# Patient Record
Sex: Male | Born: 1945 | Race: White | Hispanic: No | Marital: Married | Smoking: Former smoker
Health system: Southern US, Community
[De-identification: ages and names within clinical notes are randomized; demographics above are authoritative.]

## PROBLEM LIST (undated history)

## (undated) DIAGNOSIS — I10 Essential (primary) hypertension: Secondary | ICD-10-CM

## (undated) DIAGNOSIS — J449 Chronic obstructive pulmonary disease, unspecified: Secondary | ICD-10-CM

## (undated) DIAGNOSIS — C61 Malignant neoplasm of prostate: Secondary | ICD-10-CM

## (undated) DIAGNOSIS — E1139 Type 2 diabetes mellitus with other diabetic ophthalmic complication: Secondary | ICD-10-CM

## (undated) DIAGNOSIS — M545 Low back pain, unspecified: Secondary | ICD-10-CM

## (undated) DIAGNOSIS — C220 Liver cell carcinoma: Secondary | ICD-10-CM

## (undated) DIAGNOSIS — F329 Major depressive disorder, single episode, unspecified: Secondary | ICD-10-CM

## (undated) DIAGNOSIS — I5032 Chronic diastolic (congestive) heart failure: Secondary | ICD-10-CM

## (undated) DIAGNOSIS — E785 Hyperlipidemia, unspecified: Secondary | ICD-10-CM

## (undated) DIAGNOSIS — E1165 Type 2 diabetes mellitus with hyperglycemia: Secondary | ICD-10-CM

## (undated) DIAGNOSIS — E11339 Type 2 diabetes mellitus with moderate nonproliferative diabetic retinopathy without macular edema: Secondary | ICD-10-CM

## (undated) DIAGNOSIS — M79643 Pain in unspecified hand: Secondary | ICD-10-CM

## (undated) DIAGNOSIS — I2510 Atherosclerotic heart disease of native coronary artery without angina pectoris: Secondary | ICD-10-CM

## (undated) DIAGNOSIS — E669 Obesity, unspecified: Secondary | ICD-10-CM

## (undated) DIAGNOSIS — Z87891 Personal history of nicotine dependence: Secondary | ICD-10-CM

## (undated) DIAGNOSIS — IMO0002 Reserved for concepts with insufficient information to code with codable children: Secondary | ICD-10-CM

## (undated) DIAGNOSIS — K219 Gastro-esophageal reflux disease without esophagitis: Secondary | ICD-10-CM

## (undated) DIAGNOSIS — H409 Unspecified glaucoma: Secondary | ICD-10-CM

## (undated) DIAGNOSIS — F411 Generalized anxiety disorder: Secondary | ICD-10-CM

## (undated) DIAGNOSIS — E039 Hypothyroidism, unspecified: Secondary | ICD-10-CM

## (undated) DIAGNOSIS — M25579 Pain in unspecified ankle and joints of unspecified foot: Secondary | ICD-10-CM

## (undated) DIAGNOSIS — F3289 Other specified depressive episodes: Secondary | ICD-10-CM

## (undated) DIAGNOSIS — I878 Other specified disorders of veins: Secondary | ICD-10-CM

## (undated) DIAGNOSIS — G4730 Sleep apnea, unspecified: Secondary | ICD-10-CM

## (undated) HISTORY — DX: Obesity, unspecified: E66.9

## (undated) HISTORY — DX: Atherosclerotic heart disease of native coronary artery without angina pectoris: I25.10

## (undated) HISTORY — DX: Type 2 diabetes mellitus with moderate nonproliferative diabetic retinopathy without macular edema: E11.339

## (undated) HISTORY — DX: Unspecified glaucoma: H40.9

## (undated) HISTORY — DX: Other specified disorders of veins: I87.8

## (undated) HISTORY — DX: Major depressive disorder, single episode, unspecified: F32.9

## (undated) HISTORY — DX: Malignant neoplasm of prostate: C61

## (undated) HISTORY — DX: Gastro-esophageal reflux disease without esophagitis: K21.9

## (undated) HISTORY — DX: Low back pain, unspecified: M54.50

## (undated) HISTORY — DX: Chronic diastolic (congestive) heart failure: I50.32

## (undated) HISTORY — DX: Personal history of nicotine dependence: Z87.891

## (undated) HISTORY — DX: Low back pain: M54.5

## (undated) HISTORY — PX: OTHER SURGICAL HISTORY: SHX169

## (undated) HISTORY — DX: Pain in unspecified ankle and joints of unspecified foot: M25.579

## (undated) HISTORY — DX: Sleep apnea, unspecified: G47.30

## (undated) HISTORY — DX: Type 2 diabetes mellitus with hyperglycemia: E11.65

## (undated) HISTORY — DX: Essential (primary) hypertension: I10

## (undated) HISTORY — DX: Other specified depressive episodes: F32.89

## (undated) HISTORY — DX: Morbid (severe) obesity due to excess calories: E66.01

## (undated) HISTORY — DX: Type 2 diabetes mellitus with other diabetic ophthalmic complication: E11.39

## (undated) HISTORY — DX: Reserved for concepts with insufficient information to code with codable children: IMO0002

## (undated) HISTORY — DX: Hypothyroidism, unspecified: E03.9

## (undated) HISTORY — DX: Hyperlipidemia, unspecified: E78.5

## (undated) HISTORY — DX: Generalized anxiety disorder: F41.1

## (undated) HISTORY — DX: Pain in unspecified hand: M79.643

## (undated) HISTORY — DX: Chronic obstructive pulmonary disease, unspecified: J44.9

## (undated) HISTORY — DX: Liver cell carcinoma: C22.0

---

## 1967-11-07 DIAGNOSIS — Z87891 Personal history of nicotine dependence: Secondary | ICD-10-CM

## 1995-11-07 DIAGNOSIS — I10 Essential (primary) hypertension: Secondary | ICD-10-CM | POA: Insufficient documentation

## 1995-11-07 DIAGNOSIS — M545 Low back pain: Secondary | ICD-10-CM

## 1995-11-07 DIAGNOSIS — E1165 Type 2 diabetes mellitus with hyperglycemia: Secondary | ICD-10-CM

## 1995-11-07 DIAGNOSIS — E1139 Type 2 diabetes mellitus with other diabetic ophthalmic complication: Secondary | ICD-10-CM

## 1995-11-07 DIAGNOSIS — E039 Hypothyroidism, unspecified: Secondary | ICD-10-CM | POA: Insufficient documentation

## 1995-11-07 DIAGNOSIS — E113399 Type 2 diabetes mellitus with moderate nonproliferative diabetic retinopathy without macular edema, unspecified eye: Secondary | ICD-10-CM | POA: Insufficient documentation

## 1995-11-07 DIAGNOSIS — I209 Angina pectoris, unspecified: Secondary | ICD-10-CM

## 1995-11-07 DIAGNOSIS — F411 Generalized anxiety disorder: Secondary | ICD-10-CM | POA: Insufficient documentation

## 1995-11-07 DIAGNOSIS — E11339 Type 2 diabetes mellitus with moderate nonproliferative diabetic retinopathy without macular edema: Secondary | ICD-10-CM

## 1996-11-06 HISTORY — PX: FRACTURE SURGERY: SHX138

## 1996-11-06 HISTORY — PX: KNEE ARTHROSCOPY: SUR90

## 1997-04-06 DIAGNOSIS — E785 Hyperlipidemia, unspecified: Secondary | ICD-10-CM

## 1997-11-06 DIAGNOSIS — C61 Malignant neoplasm of prostate: Secondary | ICD-10-CM

## 1997-11-06 HISTORY — DX: Malignant neoplasm of prostate: C61

## 1997-11-06 HISTORY — PX: CORONARY STENT PLACEMENT: SHX1402

## 1998-03-06 DIAGNOSIS — K219 Gastro-esophageal reflux disease without esophagitis: Secondary | ICD-10-CM

## 1998-05-06 ENCOUNTER — Encounter: Payer: Self-pay | Admitting: Family Medicine

## 1998-05-06 LAB — CONVERTED CEMR LAB: PSA: 5.9 ng/mL

## 1998-06-06 DIAGNOSIS — C61 Malignant neoplasm of prostate: Secondary | ICD-10-CM

## 1998-06-06 HISTORY — PX: PROSTATECTOMY: SHX69

## 1998-09-14 DIAGNOSIS — I2510 Atherosclerotic heart disease of native coronary artery without angina pectoris: Secondary | ICD-10-CM

## 1998-11-06 HISTORY — PX: CORONARY STENT PLACEMENT: SHX1402

## 1999-06-07 ENCOUNTER — Encounter: Payer: Self-pay | Admitting: Family Medicine

## 1999-06-07 LAB — CONVERTED CEMR LAB: Microalbumin U total vol: 248.7 mg/L

## 2001-05-06 ENCOUNTER — Encounter: Payer: Self-pay | Admitting: Family Medicine

## 2002-03-06 ENCOUNTER — Encounter: Payer: Self-pay | Admitting: Family Medicine

## 2002-03-06 LAB — CONVERTED CEMR LAB
Microalbumin U total vol: 22.9 mg/L
PSA: 0 ng/mL

## 2003-09-24 ENCOUNTER — Other Ambulatory Visit: Payer: Self-pay

## 2004-06-06 ENCOUNTER — Encounter: Payer: Self-pay | Admitting: Family Medicine

## 2004-06-06 LAB — CONVERTED CEMR LAB: Microalbumin U total vol: 73.7 mg/L

## 2004-09-27 ENCOUNTER — Ambulatory Visit: Payer: Self-pay | Admitting: Family Medicine

## 2004-10-27 ENCOUNTER — Ambulatory Visit: Payer: Self-pay | Admitting: Family Medicine

## 2005-01-25 ENCOUNTER — Ambulatory Visit: Payer: Self-pay | Admitting: Family Medicine

## 2005-03-14 ENCOUNTER — Ambulatory Visit: Payer: No Typology Code available for payment source | Admitting: Urology

## 2005-03-14 ENCOUNTER — Other Ambulatory Visit: Payer: Self-pay

## 2005-03-20 ENCOUNTER — Ambulatory Visit: Payer: No Typology Code available for payment source | Admitting: Urology

## 2005-06-06 ENCOUNTER — Encounter: Payer: Self-pay | Admitting: Family Medicine

## 2005-06-06 LAB — CONVERTED CEMR LAB
Hgb A1c MFr Bld: 8.5 %
Microalbumin U total vol: 47.9 mg/L
PSA: 0 ng/mL

## 2005-07-06 ENCOUNTER — Ambulatory Visit: Payer: Self-pay | Admitting: Family Medicine

## 2005-07-13 ENCOUNTER — Ambulatory Visit: Payer: Self-pay | Admitting: Family Medicine

## 2005-07-21 ENCOUNTER — Ambulatory Visit: Payer: Self-pay | Admitting: Family Medicine

## 2005-07-31 ENCOUNTER — Ambulatory Visit: Payer: Self-pay | Admitting: Family Medicine

## 2005-08-04 ENCOUNTER — Ambulatory Visit: Payer: Self-pay | Admitting: Family Medicine

## 2005-09-20 ENCOUNTER — Ambulatory Visit: Payer: Self-pay | Admitting: Family Medicine

## 2005-12-07 ENCOUNTER — Encounter: Payer: Self-pay | Admitting: Family Medicine

## 2005-12-07 LAB — CONVERTED CEMR LAB: Hgb A1c MFr Bld: 9.2 %

## 2005-12-19 ENCOUNTER — Ambulatory Visit: Payer: Self-pay | Admitting: Family Medicine

## 2006-03-19 ENCOUNTER — Ambulatory Visit: Payer: Self-pay | Admitting: Family Medicine

## 2006-04-20 ENCOUNTER — Ambulatory Visit: Payer: Self-pay | Admitting: Family Medicine

## 2006-05-21 ENCOUNTER — Ambulatory Visit: Payer: Self-pay | Admitting: Family Medicine

## 2006-06-06 ENCOUNTER — Encounter: Payer: Self-pay | Admitting: Family Medicine

## 2006-06-06 LAB — CONVERTED CEMR LAB: Hgb A1c MFr Bld: 8.7 %

## 2006-06-25 ENCOUNTER — Ambulatory Visit: Payer: Self-pay | Admitting: Family Medicine

## 2006-07-23 ENCOUNTER — Ambulatory Visit: Payer: Self-pay | Admitting: Family Medicine

## 2006-08-20 ENCOUNTER — Ambulatory Visit: Payer: Self-pay | Admitting: Family Medicine

## 2006-09-20 ENCOUNTER — Ambulatory Visit: Payer: Self-pay | Admitting: Family Medicine

## 2006-09-21 ENCOUNTER — Ambulatory Visit: Payer: Self-pay | Admitting: Cardiology

## 2006-09-21 ENCOUNTER — Encounter: Payer: Self-pay | Admitting: Cardiology

## 2006-10-22 ENCOUNTER — Ambulatory Visit: Payer: Self-pay | Admitting: Family Medicine

## 2006-10-24 ENCOUNTER — Ambulatory Visit: Payer: Self-pay | Admitting: Cardiology

## 2007-01-08 ENCOUNTER — Ambulatory Visit: Payer: Self-pay | Admitting: Family Medicine

## 2007-01-08 LAB — CONVERTED CEMR LAB
Free T4: 0.7 ng/dL (ref 0.6–1.6)
TSH: 2.76 microintl units/mL (ref 0.35–5.50)

## 2007-02-08 ENCOUNTER — Encounter: Payer: Self-pay | Admitting: Family Medicine

## 2007-03-18 ENCOUNTER — Telehealth (INDEPENDENT_AMBULATORY_CARE_PROVIDER_SITE_OTHER): Payer: Self-pay | Admitting: *Deleted

## 2007-04-17 ENCOUNTER — Telehealth (INDEPENDENT_AMBULATORY_CARE_PROVIDER_SITE_OTHER): Payer: Self-pay | Admitting: *Deleted

## 2007-05-21 ENCOUNTER — Telehealth (INDEPENDENT_AMBULATORY_CARE_PROVIDER_SITE_OTHER): Payer: Self-pay | Admitting: *Deleted

## 2007-06-04 ENCOUNTER — Encounter (INDEPENDENT_AMBULATORY_CARE_PROVIDER_SITE_OTHER): Payer: Self-pay | Admitting: *Deleted

## 2007-06-14 ENCOUNTER — Ambulatory Visit: Payer: Self-pay | Admitting: Family Medicine

## 2007-06-19 ENCOUNTER — Ambulatory Visit: Payer: Self-pay | Admitting: Family Medicine

## 2007-06-21 ENCOUNTER — Ambulatory Visit: Payer: Self-pay | Admitting: Cardiology

## 2007-06-23 LAB — CONVERTED CEMR LAB
AST: 37 units/L (ref 0–37)
Bilirubin, Direct: 0.2 mg/dL (ref 0.0–0.3)
CO2: 31 meq/L (ref 19–32)
Chloride: 101 meq/L (ref 96–112)
Cholesterol: 136 mg/dL (ref 0–200)
Creatinine, Ser: 1.1 mg/dL (ref 0.4–1.5)
Creatinine,U: 154.9 mg/dL
Eosinophils Relative: 2.5 % (ref 0.0–5.0)
Glucose, Bld: 311 mg/dL — ABNORMAL HIGH (ref 70–99)
HCT: 46.4 % (ref 39.0–52.0)
Hemoglobin: 15.9 g/dL (ref 13.0–17.0)
LDL Cholesterol: 87 mg/dL (ref 0–99)
MCHC: 34.3 g/dL (ref 30.0–36.0)
Microalb, Ur: 18.5 mg/dL — ABNORMAL HIGH (ref 0.0–1.9)
Monocytes Absolute: 0.9 10*3/uL — ABNORMAL HIGH (ref 0.2–0.7)
Neutrophils Relative %: 61.2 % (ref 43.0–77.0)
PSA: <0.03 ng/mL — ABNORMAL LOW (ref 0.10–4.00)
Potassium: 4.5 meq/L (ref 3.5–5.1)
RBC: 5.19 M/uL (ref 4.22–5.81)
RDW: 13.2 % (ref 11.5–14.6)
Sodium: 141 meq/L (ref 135–145)
Total Bilirubin: 0.8 mg/dL (ref 0.3–1.2)
Total CHOL/HDL Ratio: 4.1
Total Protein: 6.9 g/dL (ref 6.0–8.3)
WBC: 9 10*3/uL (ref 4.5–10.5)

## 2007-06-25 ENCOUNTER — Ambulatory Visit: Payer: Self-pay | Admitting: Family Medicine

## 2007-07-01 ENCOUNTER — Ambulatory Visit: Payer: No Typology Code available for payment source | Admitting: Specialist

## 2007-07-12 ENCOUNTER — Telehealth (INDEPENDENT_AMBULATORY_CARE_PROVIDER_SITE_OTHER): Payer: Self-pay | Admitting: *Deleted

## 2007-08-14 ENCOUNTER — Telehealth (INDEPENDENT_AMBULATORY_CARE_PROVIDER_SITE_OTHER): Payer: Self-pay | Admitting: *Deleted

## 2007-09-12 ENCOUNTER — Telehealth (INDEPENDENT_AMBULATORY_CARE_PROVIDER_SITE_OTHER): Payer: Self-pay | Admitting: *Deleted

## 2007-10-14 ENCOUNTER — Telehealth (INDEPENDENT_AMBULATORY_CARE_PROVIDER_SITE_OTHER): Payer: Self-pay | Admitting: *Deleted

## 2007-11-14 ENCOUNTER — Telehealth: Payer: Self-pay | Admitting: Family Medicine

## 2007-12-16 ENCOUNTER — Telehealth (INDEPENDENT_AMBULATORY_CARE_PROVIDER_SITE_OTHER): Payer: Self-pay | Admitting: *Deleted

## 2007-12-19 ENCOUNTER — Encounter (INDEPENDENT_AMBULATORY_CARE_PROVIDER_SITE_OTHER): Payer: Self-pay | Admitting: *Deleted

## 2007-12-19 ENCOUNTER — Ambulatory Visit: Payer: Self-pay | Admitting: Family Medicine

## 2008-01-14 ENCOUNTER — Telehealth: Payer: Self-pay | Admitting: Family Medicine

## 2008-02-10 ENCOUNTER — Telehealth: Payer: Self-pay | Admitting: Family Medicine

## 2008-03-07 ENCOUNTER — Emergency Department: Payer: No Typology Code available for payment source | Admitting: Emergency Medicine

## 2008-03-12 ENCOUNTER — Telehealth: Payer: Self-pay | Admitting: Family Medicine

## 2008-04-09 ENCOUNTER — Telehealth: Payer: Self-pay | Admitting: Family Medicine

## 2008-05-01 IMAGING — CR DG ANKLE 2V *L*
1 series · 2 of 2 positions shown · non-contrast
Comparison: none

REASON FOR EXAM: hardware removal
COMMENTS:

[Series 1: view not recorded · 0.17mm/px · 2 of 2 slices shown]
[im 1/2]
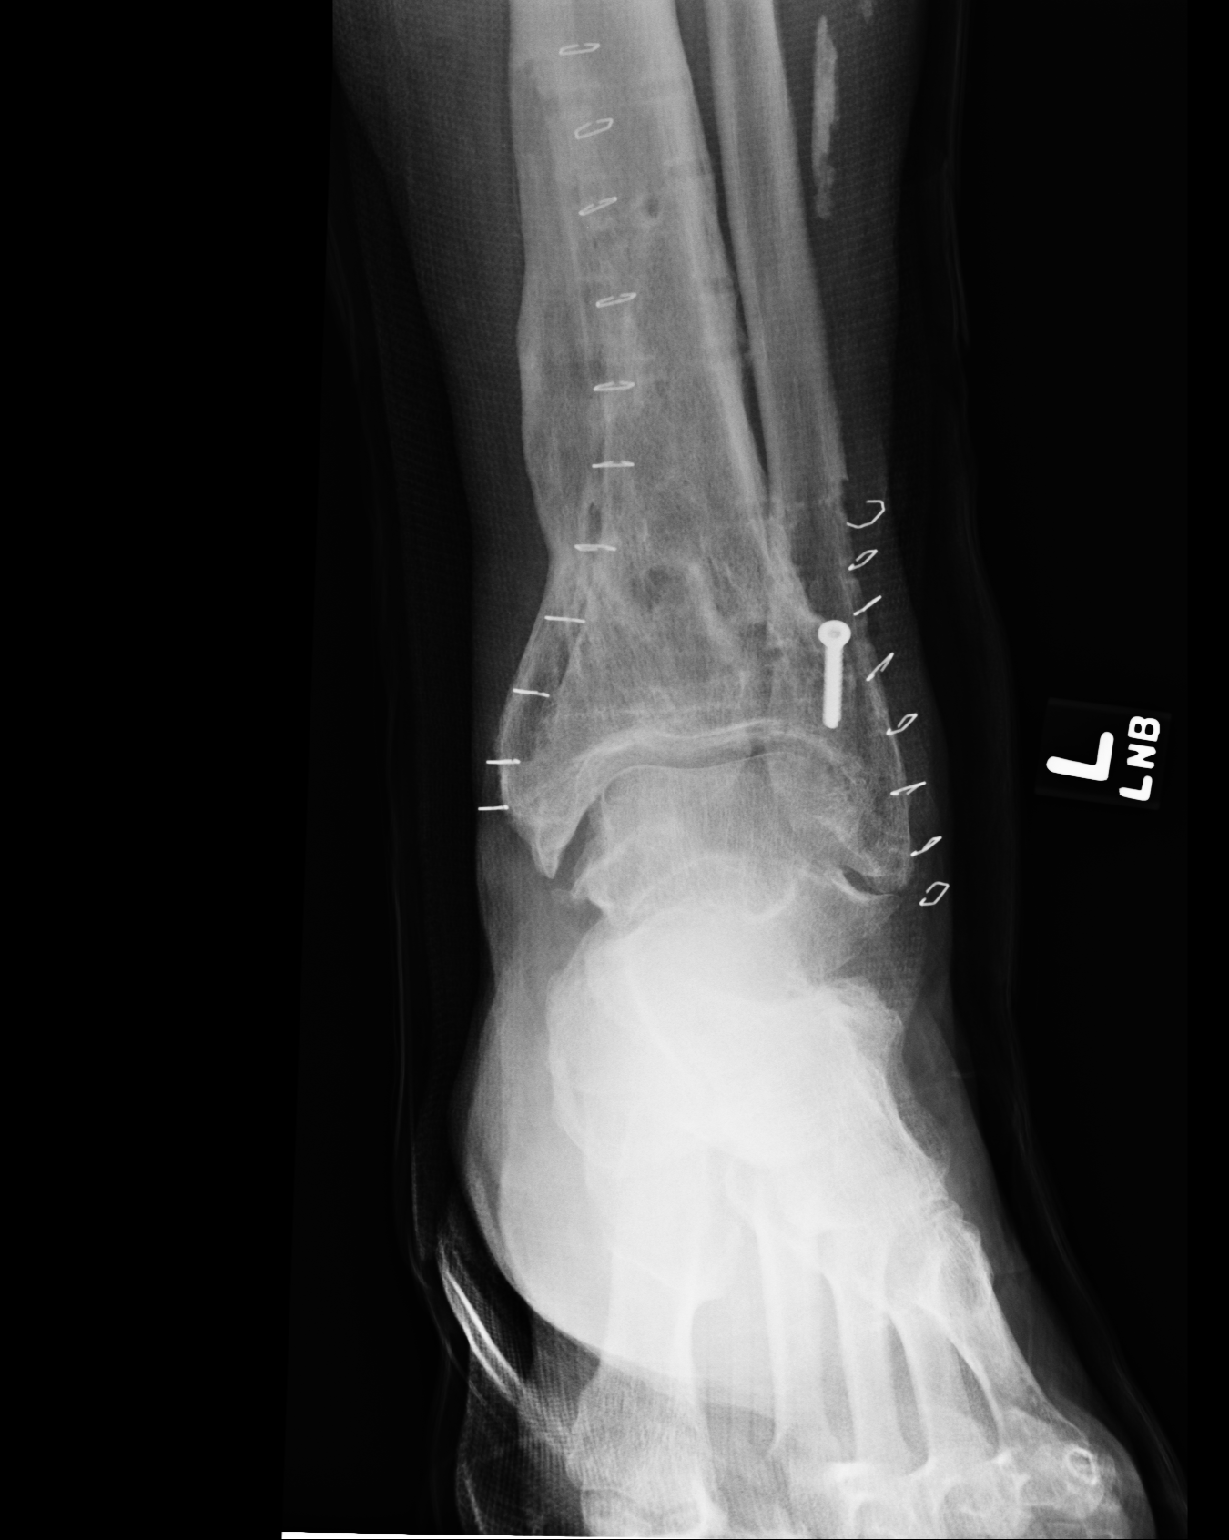
[im 2/2]
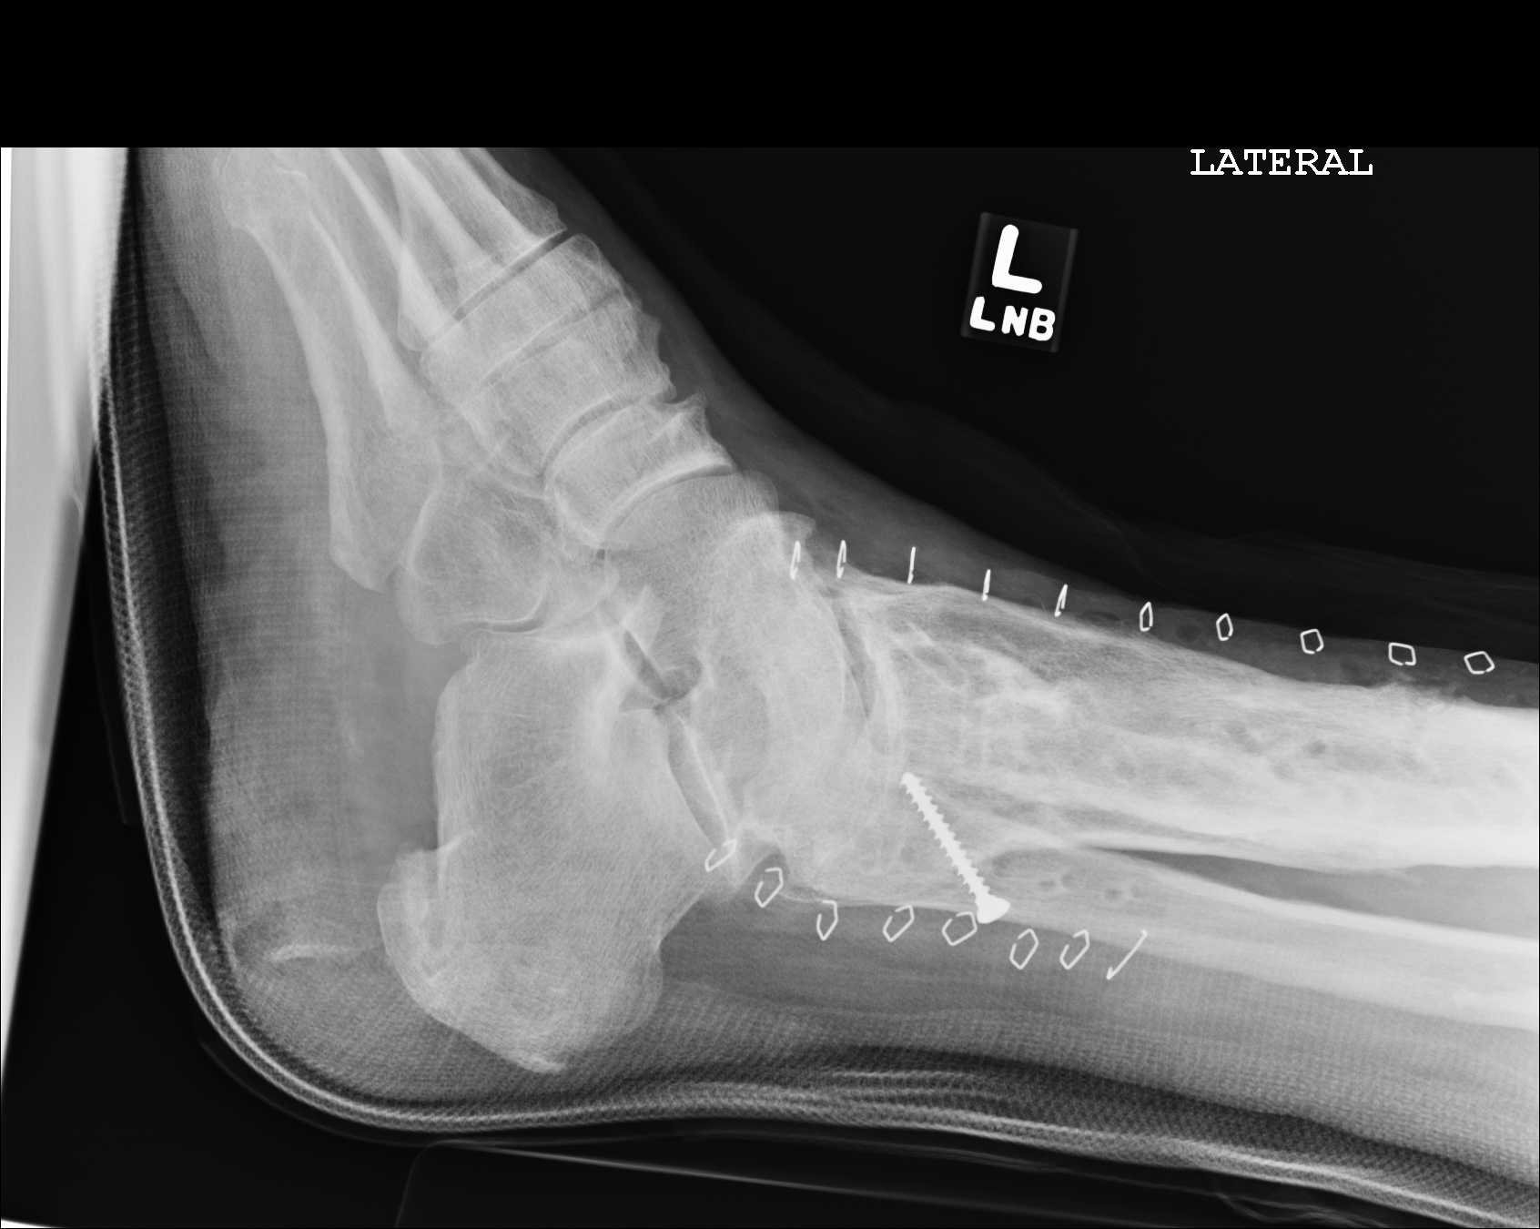

[2 of 2 positions shown; findings below may reference images not displayed]

PROCEDURE:     DXR - DXR ANKLE LEFT AP AND LATERAL  - July 01, 2007  [DATE]

RESULT:     AP and lateral views were obtained postoperatively. There are
noted screw tracks in the distal tibia compatible with the presence of
stabilizing hardware that has now been removed. It appears that a fibular
side plate has also been removed. There persists a single screw directed in
the posterior anterior plane in the distal fibula. There is deformity of the
distal tibia compatible with residual change from the patient's prior
fracture that has now healed.
IMPRESSION: Please see above.

## 2008-05-11 ENCOUNTER — Telehealth: Payer: Self-pay | Admitting: Family Medicine

## 2008-06-11 ENCOUNTER — Telehealth (INDEPENDENT_AMBULATORY_CARE_PROVIDER_SITE_OTHER): Payer: Self-pay | Admitting: Internal Medicine

## 2008-06-25 ENCOUNTER — Ambulatory Visit: Payer: Self-pay | Admitting: Family Medicine

## 2008-06-25 LAB — CONVERTED CEMR LAB
ALT: 42 units/L (ref 0–53)
CO2: 29 meq/L (ref 19–32)
Calcium: 9.1 mg/dL (ref 8.4–10.5)
Creatinine, Ser: 0.9 mg/dL (ref 0.4–1.5)
Creatinine,U: 244.1 mg/dL
Free T4: 1 ng/dL (ref 0.6–1.6)
GFR calc Af Amer: 110 mL/min
HDL: 33 mg/dL — ABNORMAL LOW (ref >39.0)
LDL Cholesterol: 126 mg/dL — ABNORMAL HIGH (ref 0–99)
Microalb Creat Ratio: 68.4 mg/g — ABNORMAL HIGH (ref 0.0–30.0)
Microalb, Ur: 16.7 mg/dL — ABNORMAL HIGH (ref 0.0–1.9)
Total Bilirubin: 0.9 mg/dL (ref 0.3–1.2)
Total CHOL/HDL Ratio: 5.3
Total Protein: 6.6 g/dL (ref 6.0–8.3)
Triglycerides: 78 mg/dL (ref 0–149)
VLDL: 16 mg/dL (ref 0–40)

## 2008-06-29 ENCOUNTER — Ambulatory Visit: Payer: Self-pay | Admitting: Family Medicine

## 2008-08-12 ENCOUNTER — Telehealth: Payer: Self-pay | Admitting: Family Medicine

## 2008-09-11 ENCOUNTER — Telehealth: Payer: Self-pay | Admitting: Family Medicine

## 2008-09-15 ENCOUNTER — Ambulatory Visit: Payer: Self-pay | Admitting: Family Medicine

## 2008-09-15 LAB — CONVERTED CEMR LAB
HDL: 27.7 mg/dL — ABNORMAL LOW (ref >39.0)
LDL Cholesterol: 98 mg/dL (ref 0–99)
Total CHOL/HDL Ratio: 5
Triglycerides: 68 mg/dL (ref 0–149)

## 2008-09-28 ENCOUNTER — Ambulatory Visit: Payer: Self-pay | Admitting: Family Medicine

## 2008-10-08 ENCOUNTER — Telehealth: Payer: Self-pay | Admitting: Family Medicine

## 2008-11-09 ENCOUNTER — Telehealth: Payer: Self-pay | Admitting: Family Medicine

## 2008-12-09 ENCOUNTER — Telehealth: Payer: Self-pay | Admitting: Family Medicine

## 2009-01-07 ENCOUNTER — Telehealth: Payer: Self-pay | Admitting: Family Medicine

## 2009-02-08 ENCOUNTER — Telehealth: Payer: Self-pay | Admitting: Family Medicine

## 2009-03-09 ENCOUNTER — Telehealth: Payer: Self-pay | Admitting: Family Medicine

## 2009-04-06 ENCOUNTER — Telehealth: Payer: Self-pay | Admitting: Family Medicine

## 2009-05-06 ENCOUNTER — Telehealth: Payer: Self-pay | Admitting: Family Medicine

## 2009-06-07 ENCOUNTER — Telehealth: Payer: Self-pay | Admitting: Family Medicine

## 2009-07-01 ENCOUNTER — Ambulatory Visit: Payer: Self-pay | Admitting: Family Medicine

## 2009-07-01 LAB — CONVERTED CEMR LAB
ALT: 42 units/L (ref 0–53)
Basophils Relative: 0.5 % (ref 0.0–3.0)
Bilirubin, Direct: 0.1 mg/dL (ref 0.0–0.3)
Chloride: 99 meq/L (ref 96–112)
Eosinophils Relative: 2.2 % (ref 0.0–5.0)
Free T4: 0.9 ng/dL (ref 0.6–1.6)
HCT: 48.1 % (ref 39.0–52.0)
Hemoglobin: 16.2 g/dL (ref 13.0–17.0)
Hgb A1c MFr Bld: 10.9 % — ABNORMAL HIGH (ref 4.6–6.5)
LDL Cholesterol: 105 mg/dL — ABNORMAL HIGH (ref 0–99)
Lymphs Abs: 2 10*3/uL (ref 0.7–4.0)
MCV: 87.7 fL (ref 78.0–100.0)
Monocytes Absolute: 0.7 10*3/uL (ref 0.1–1.0)
Potassium: 4.1 meq/L (ref 3.5–5.1)
RBC: 5.49 M/uL (ref 4.22–5.81)
TSH: 4.67 microintl units/mL (ref 0.35–5.50)
Total CHOL/HDL Ratio: 5
Total Protein: 6.9 g/dL (ref 6.0–8.3)
WBC: 7.2 10*3/uL (ref 4.5–10.5)

## 2009-07-05 ENCOUNTER — Ambulatory Visit: Payer: Self-pay | Admitting: Family Medicine

## 2009-07-05 DIAGNOSIS — E559 Vitamin D deficiency, unspecified: Secondary | ICD-10-CM | POA: Insufficient documentation

## 2009-07-26 ENCOUNTER — Ambulatory Visit: Payer: Self-pay | Admitting: Family Medicine

## 2009-07-26 LAB — CONVERTED CEMR LAB
OCCULT 2: NEGATIVE
OCCULT 3: NEGATIVE

## 2009-07-27 ENCOUNTER — Encounter (INDEPENDENT_AMBULATORY_CARE_PROVIDER_SITE_OTHER): Payer: Self-pay | Admitting: *Deleted

## 2009-08-06 ENCOUNTER — Telehealth: Payer: Self-pay | Admitting: Family Medicine

## 2009-08-16 ENCOUNTER — Ambulatory Visit: Payer: Self-pay | Admitting: Family Medicine

## 2009-09-06 ENCOUNTER — Telehealth: Payer: Self-pay | Admitting: Family Medicine

## 2009-09-28 ENCOUNTER — Ambulatory Visit: Payer: Self-pay | Admitting: Family Medicine

## 2009-09-28 LAB — CONVERTED CEMR LAB
Chloride: 102 meq/L (ref 96–112)
Creatinine, Ser: 1 mg/dL (ref 0.4–1.5)
Creatinine,U: 216.8 mg/dL
GFR calc non Af Amer: 80.26 mL/min (ref >60)
Microalb, Ur: 20.7 mg/dL — ABNORMAL HIGH (ref 0.0–1.9)

## 2009-09-29 LAB — CONVERTED CEMR LAB: Vit D, 25-Hydroxy: 27 ng/mL — ABNORMAL LOW (ref 30–89)

## 2009-10-05 ENCOUNTER — Ambulatory Visit: Payer: Self-pay | Admitting: Family Medicine

## 2009-11-06 HISTORY — PX: OTHER SURGICAL HISTORY: SHX169

## 2009-11-06 HISTORY — PX: CT ABD WO & PELVIS W CM: HXRAD296

## 2009-11-08 ENCOUNTER — Telehealth: Payer: Self-pay | Admitting: Family Medicine

## 2009-12-07 ENCOUNTER — Telehealth: Payer: Self-pay | Admitting: Family Medicine

## 2010-01-04 ENCOUNTER — Telehealth: Payer: Self-pay | Admitting: Family Medicine

## 2010-02-03 ENCOUNTER — Telehealth: Payer: Self-pay | Admitting: Family Medicine

## 2010-03-07 ENCOUNTER — Telehealth: Payer: Self-pay | Admitting: Family Medicine

## 2010-04-11 ENCOUNTER — Telehealth: Payer: Self-pay | Admitting: Family Medicine

## 2010-05-02 ENCOUNTER — Telehealth: Payer: Self-pay | Admitting: Family Medicine

## 2010-05-09 ENCOUNTER — Encounter: Payer: Self-pay | Admitting: Family Medicine

## 2010-05-11 ENCOUNTER — Telehealth: Payer: Self-pay | Admitting: Family Medicine

## 2010-06-06 ENCOUNTER — Telehealth: Payer: Self-pay | Admitting: Family Medicine

## 2010-06-09 ENCOUNTER — Encounter (INDEPENDENT_AMBULATORY_CARE_PROVIDER_SITE_OTHER): Payer: Self-pay | Admitting: *Deleted

## 2010-06-13 ENCOUNTER — Encounter: Payer: Self-pay | Admitting: Family Medicine

## 2010-06-13 ENCOUNTER — Ambulatory Visit: Payer: Self-pay | Admitting: Internal Medicine

## 2010-06-13 DIAGNOSIS — M109 Gout, unspecified: Secondary | ICD-10-CM

## 2010-06-13 DIAGNOSIS — G894 Chronic pain syndrome: Secondary | ICD-10-CM | POA: Insufficient documentation

## 2010-06-14 LAB — CONVERTED CEMR LAB
ALT: 27 units/L (ref 0–53)
Albumin: 3.7 g/dL (ref 3.5–5.2)
BUN: 13 mg/dL (ref 6–23)
Basophils Relative: 0.6 % (ref 0.0–3.0)
CO2: 26 meq/L (ref 19–32)
Chloride: 102 meq/L (ref 96–112)
Creatinine, Ser: 0.9 mg/dL (ref 0.4–1.5)
Eosinophils Relative: 2.5 % (ref 0.0–5.0)
Glucose, Bld: 257 mg/dL — ABNORMAL HIGH (ref 70–99)
Hgb A1c MFr Bld: 11 % — ABNORMAL HIGH (ref 4.6–6.5)
Lymphocytes Relative: 29.5 % (ref 12.0–46.0)
Microalb Creat Ratio: 8.3 mg/g (ref 0.0–30.0)
Microalb, Ur: 11.7 mg/dL — ABNORMAL HIGH (ref 0.0–1.9)
Neutrophils Relative %: 58.9 % (ref 43.0–77.0)
PSA: 0 ng/mL — ABNORMAL LOW (ref 0.10–4.00)
Platelets: 215 10*3/uL (ref 150.0–400.0)
RBC: 5.24 M/uL (ref 4.22–5.81)
TSH: 6.69 microintl units/mL — ABNORMAL HIGH (ref 0.35–5.50)
Total Protein: 6.5 g/dL (ref 6.0–8.3)
WBC: 8.2 10*3/uL (ref 4.5–10.5)

## 2010-06-15 ENCOUNTER — Telehealth: Payer: Self-pay | Admitting: Family Medicine

## 2010-07-07 ENCOUNTER — Telehealth: Payer: Self-pay | Admitting: Family Medicine

## 2010-07-14 ENCOUNTER — Ambulatory Visit: Payer: Self-pay | Admitting: Internal Medicine

## 2010-07-14 ENCOUNTER — Encounter: Payer: Self-pay | Admitting: Family Medicine

## 2010-07-14 DIAGNOSIS — F322 Major depressive disorder, single episode, severe without psychotic features: Secondary | ICD-10-CM | POA: Insufficient documentation

## 2010-07-14 LAB — CONVERTED CEMR LAB
HDL: 32.6 mg/dL — ABNORMAL LOW (ref >39.00)
LDL Cholesterol: 106 mg/dL — ABNORMAL HIGH (ref 0–99)
Total CHOL/HDL Ratio: 5

## 2010-08-06 ENCOUNTER — Ambulatory Visit: Payer: No Typology Code available for payment source | Admitting: Oncology

## 2010-08-06 HISTORY — PX: ESOPHAGOGASTRODUODENOSCOPY: SHX1529

## 2010-08-06 HISTORY — PX: COLONOSCOPY: SHX174

## 2010-08-08 ENCOUNTER — Telehealth: Payer: Self-pay | Admitting: Family Medicine

## 2010-08-17 ENCOUNTER — Ambulatory Visit: Payer: Self-pay | Admitting: Family Medicine

## 2010-08-21 ENCOUNTER — Encounter: Payer: Self-pay | Admitting: Family Medicine

## 2010-08-21 LAB — CONVERTED CEMR LAB
ALT: 28 units/L
AST: 18 units/L
Brain Natriuretic Peptide: 321
Total Bilirubin: 0.5 mg/dL
Total Protein: 6.7 g/dL

## 2010-08-22 ENCOUNTER — Telehealth: Payer: Self-pay | Admitting: Family Medicine

## 2010-08-22 ENCOUNTER — Inpatient Hospital Stay: Payer: No Typology Code available for payment source | Admitting: Internal Medicine

## 2010-08-22 ENCOUNTER — Encounter: Payer: Self-pay | Admitting: Family Medicine

## 2010-08-23 ENCOUNTER — Encounter: Payer: Self-pay | Admitting: Family Medicine

## 2010-08-25 ENCOUNTER — Encounter: Payer: Self-pay | Admitting: Family Medicine

## 2010-08-26 ENCOUNTER — Encounter: Payer: Self-pay | Admitting: Family Medicine

## 2010-08-26 LAB — PATHOLOGY REPORT

## 2010-08-30 ENCOUNTER — Ambulatory Visit: Payer: Self-pay | Admitting: Family Medicine

## 2010-08-30 DIAGNOSIS — R1011 Right upper quadrant pain: Secondary | ICD-10-CM

## 2010-08-31 LAB — CONVERTED CEMR LAB
AST: 23 units/L (ref 0–37)
Albumin: 3.6 g/dL (ref 3.5–5.2)
Alkaline Phosphatase: 74 units/L (ref 39–117)
BUN: 10 mg/dL (ref 6–23)
Basophils Absolute: 0 10*3/uL (ref 0.0–0.1)
CO2: 32 meq/L (ref 19–32)
Calcium: 9.7 mg/dL (ref 8.4–10.5)
Creatinine, Ser: 1 mg/dL (ref 0.4–1.5)
Eosinophils Absolute: 0.1 10*3/uL (ref 0.0–0.7)
GFR calc non Af Amer: 80.96 mL/min (ref >60)
Glucose, Bld: 107 mg/dL — ABNORMAL HIGH (ref 70–99)
Lymphocytes Relative: 21.4 % (ref 12.0–46.0)
MCHC: 33 g/dL (ref 30.0–36.0)
Monocytes Relative: 9.7 % (ref 3.0–12.0)
Neutro Abs: 6 10*3/uL (ref 1.4–7.7)
Neutrophils Relative %: 66.7 % (ref 43.0–77.0)
Platelets: 256 10*3/uL (ref 150.0–400.0)
RDW: 14.9 % — ABNORMAL HIGH (ref 11.5–14.6)
Sodium: 140 meq/L (ref 135–145)
Total Bilirubin: 0.7 mg/dL (ref 0.3–1.2)

## 2010-09-06 ENCOUNTER — Ambulatory Visit: Payer: No Typology Code available for payment source | Admitting: Oncology

## 2010-09-06 ENCOUNTER — Telehealth: Payer: Self-pay | Admitting: Family Medicine

## 2010-09-14 ENCOUNTER — Ambulatory Visit: Payer: Self-pay | Admitting: Family Medicine

## 2010-10-18 ENCOUNTER — Ambulatory Visit: Payer: Self-pay | Admitting: Family Medicine

## 2010-11-08 ENCOUNTER — Telehealth: Payer: Self-pay | Admitting: Family Medicine

## 2010-11-16 ENCOUNTER — Ambulatory Visit: Admit: 2010-11-16 | Payer: Self-pay | Admitting: Family Medicine

## 2010-11-17 ENCOUNTER — Ambulatory Visit
Admission: RE | Admit: 2010-11-17 | Discharge: 2010-11-17 | Payer: Self-pay | Source: Home / Self Care | Attending: Family Medicine | Admitting: Family Medicine

## 2010-11-17 ENCOUNTER — Other Ambulatory Visit: Payer: Self-pay | Admitting: Family Medicine

## 2010-11-17 LAB — BASIC METABOLIC PANEL
BUN: 13 mg/dL (ref 6–23)
CO2: 25 mEq/L (ref 19–32)
Calcium: 8.6 mg/dL (ref 8.4–10.5)
Chloride: 95 mEq/L — ABNORMAL LOW (ref 96–112)
Creatinine, Ser: 0.8 mg/dL (ref 0.4–1.5)
GFR: 99.15 mL/min (ref >60.00)
Glucose, Bld: 241 mg/dL — ABNORMAL HIGH (ref 70–99)
Potassium: 4.3 mEq/L (ref 3.5–5.1)
Sodium: 138 mEq/L (ref 135–145)

## 2010-11-17 LAB — HEPATIC FUNCTION PANEL
ALT: 25 U/L (ref 0–53)
AST: 24 U/L (ref 0–37)
Albumin: 3.6 g/dL (ref 3.5–5.2)
Alkaline Phosphatase: 85 U/L (ref 39–117)
Bilirubin, Direct: 0.2 mg/dL (ref 0.0–0.3)
Total Bilirubin: 0.8 mg/dL (ref 0.3–1.2)
Total Protein: 6.5 g/dL (ref 6.0–8.3)

## 2010-11-17 LAB — HEMOGLOBIN A1C: Hgb A1c MFr Bld: 10.6 % — ABNORMAL HIGH (ref 4.6–6.5)

## 2010-11-17 LAB — TSH: TSH: 1.12 u[IU]/mL (ref 0.35–5.50)

## 2010-11-27 ENCOUNTER — Encounter: Payer: Self-pay | Admitting: Urology

## 2010-12-04 LAB — CONVERTED CEMR LAB
BUN: 17 mg/dL
CO2: 29 meq/L
Calcium: 9.1 mg/dL
Cholesterol: 136 mg/dL
Creatinine, Ser: 1.16 mg/dL
HCT: 38.8 %
HDL: 32 mg/dL
LDL Cholesterol: 88 mg/dL

## 2010-12-05 ENCOUNTER — Telehealth: Payer: Self-pay | Admitting: Family Medicine

## 2010-12-06 NOTE — Progress Notes (Signed)
Summary: regarding simvastatin  Phone Note From Pharmacy   [facility] [place] Summary of Call: Form from pharmacy regarding danger of simvastatin is on your desk. Initial call taken by: Lowella Petties CMA,  May 11, 2010 11:03 AM  Follow-up for Phone Call        Age <65, + h/o CAD and DM.  I would continue the simvastatin.  Follow-up by: Crawford Givens MD,  May 11, 2010 10:44 PM

## 2010-12-06 NOTE — Progress Notes (Signed)
Summary: Percocet and Alprazolam  Phone Note Refill Request Call back at Home Phone [phone] Message from:  Patient on April 11, 2010 2:10 PM  Refills Requested: Medication #1:  PERCOCET 5-325 MG TABS 0ne to two  tabs  by mouth every six hrs as needed for pain  Medication #2:  ALPRAZOLAM 1 MG TABS one tab at night as needed for anxiety Please call patient when prescription is ready for pickup.    Method Requested: Pick up at Office Initial call taken by: Delilah Shan CMA Duncan Dull),  April 11, 2010 2:12 PM  Follow-up for Phone Call        Patient notified by telephone that rx's are up front and ready for pickup. Follow-up by: Sydell Axon LPN,  April 12, 980 8:41 AM    Prescriptions: ALPRAZOLAM 1 MG TABS (ALPRAZOLAM) one tab at night as needed for anxiety  #30 x 0   Entered and Authorized by:   Shaune Leeks MD   Signed by:   Shaune Leeks MD on 04/11/2010   Method used:   Print then Give to Patient   RxID:   [id number] PERCOCET 5-325 MG TABS (OXYCODONE-ACETAMINOPHEN) 0ne to two  tabs  by mouth every six hrs as needed for pain  #150 x 0   Entered and Authorized by:   Shaune Leeks MD   Signed by:   Shaune Leeks MD on 04/11/2010   Method used:   Print then Give to Patient   RxID:   [id number]
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
## 2010-12-06 NOTE — Progress Notes (Signed)
Summary: Alprazolam and Percocet rx  Phone Note Refill Request Call back at [phone] Message from:  Patient on December 07, 2009 2:16 PM  Refills Requested: Medication #1:  PERCOCET 5-325 MG TABS 0ne to two  tabs  by mouth every six hrs as needed for pain  Medication #2:  ALPRAZOLAM 1 MG TABS one tab at night as needed for anxiety Pt request written rx for alprazolam and percocet. Pt request call when ready for pick up.   Method Requested: Pick up at Office Initial call taken by: Melody Comas,  December 07, 2009 2:17 PM  Follow-up for Phone Call        Patient notified that rx is up front and ready for pickup. Follow-up by: Sydell Axon LPN,  December 07, 2009 4:19 PM    Prescriptions: ALPRAZOLAM 1 MG TABS (ALPRAZOLAM) one tab at night as needed for anxiety  #30 x 0   Entered and Authorized by:   Shaune Leeks MD   Signed by:   Shaune Leeks MD on 12/07/2009   Method used:   Print then Give to Patient   RxID:   [id number] PERCOCET 5-325 MG TABS (OXYCODONE-ACETAMINOPHEN) 0ne to two  tabs  by mouth every six hrs as needed for pain  #150 x 0   Entered and Authorized by:   Shaune Leeks MD   Signed by:   Shaune Leeks MD on 12/07/2009   Method used:   Print then Give to Patient   RxID:   [id number]

## 2010-12-06 NOTE — Miscellaneous (Signed)
   Clinical Lists Changes  Observations: Added new observation of PLATELETK/UL: 217 K/uL (08/23/2010 8:27) Added new observation of MCV: 86 fL (08/23/2010 8:27) Added new observation of HCT: 38.8 % (08/23/2010 8:27) Added new observation of HGB: 12.5 g/dL (65/78/4696 2:95) Added new observation of WBC COUNT: 8.7 10*3/microliter (08/23/2010 8:27) Added new observation of CALCIUM: 9.1 mg/dL (28/41/3244 0:10) Added new observation of CREATININE: 1.16 mg/dL (27/25/3664 4:03) Added new observation of BUN: 17 mg/dL (47/42/5956 3:87) Added new observation of BG RANDOM: 196 mg/dL (56/43/3295 1:88) Added new observation of CO2 PLSM/SER: 29 meq/L (08/23/2010 8:27) Added new observation of CL SERUM: 101 meq/L (08/23/2010 8:27) Added new observation of K SERUM: 4.5 meq/L (08/23/2010 8:27) Added new observation of NA: 138 meq/L (08/23/2010 8:27) Added new observation of LDL: 88 mg/dL (41/66/0630 1:60) Added new observation of HDL: 32 mg/dL (10/93/2355 7:32) Added new observation of TRIGLYC TOT: 82 mg/dL (20/25/4270 6:23) Added new observation of CHOLESTEROL: 136 mg/dL (76/28/3151 7:61) Added new observation of LIPASE SERUM: 158 units/L (08/21/2010 8:27) Added new observation of ALBUMIN: 3.6 g/dL (60/73/7106 2:69) Added new observation of PROTEIN, TOT: 6.7 g/dL (48/54/6270 3:50) Added new observation of SGPT (ALT): 28 units/L (08/21/2010 8:27) Added new observation of SGOT (AST): 18 units/L (08/21/2010 8:27) Added new observation of ALK PHOS: 81 units/L (08/21/2010 8:27) Added new observation of BILI TOTAL: 0.5 mg/dL (09/38/1829 9:37) Added new observation of BNP: 321  (08/21/2010 8:27)
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
## 2010-12-06 NOTE — Progress Notes (Signed)
Summary: PHQ-9 19  Health Questionaire form/[facility]   Imported By: Sherian Rein 07/22/2010 07:04:19  _____________________________________________________________________  External Attachment:    Type:   Image     Comment:   External Document

## 2010-12-06 NOTE — Procedures (Signed)
Summary: EGD by Dr.Paul Oh,[facility] - WNL, polyps biopsied  Upper GI Endoscopy by Dr.Paul Oh,[facility]   Imported By: Beau Fanny 08/30/2010 10:34:27  _____________________________________________________________________  External Attachment:    Type:   Image     Comment:   External Document

## 2010-12-06 NOTE — Progress Notes (Signed)
Summary: ? OV for scooter  Phone Note Call from Patient Call back at Home Phone [phone]   Caller: Patient Call For: Shaune Leeks MD Summary of Call: [name] is wanting to get a Scooter and will need an OV with an MD in order for the paperwork to be completed.  Should he schedule OV with new MD?  He is asking if he can schedule on July 13th in the afternoon because he is about 50 miles away and has an appointment that morning in [place].  He would like to have  both  appointments the same day. Initial call taken by: Delilah Shan CMA Duncan Dull),  May 02, 2010 10:16 AM  Follow-up for Phone Call        He will need to schedule wityh a new doctor to have this done. I don't know if the scooter forms can be filled out at that appt, they are extensive. Follow-up by: Shaune Leeks MD,  May 02, 2010 1:47 PM  Additional Follow-up for Phone Call Additional follow up Details #1::        Patient Advised.  He will make an appointment when he comes by to pick up the prescriptions. Additional Follow-up by: Delilah Shan CMA Duncan Dull),  May 02, 2010 2:09 PM

## 2010-12-06 NOTE — Procedures (Signed)
Summary: Colonoscopy Dr.Paul Oh,[facility], polyps o/w nl   Colonoscopy by Dr.Paul Oh,[facility]   Imported By: Beau Fanny 08/30/2010 10:33:35  _____________________________________________________________________  External Attachment:    Type:   Image     Comment:   External Document

## 2010-12-06 NOTE — Progress Notes (Signed)
Summary: pt requests phone call  Phone Note Call from Patient Call back at Home Phone [phone]   Caller: Patient Call For: [name] Boyden  MD Summary of Call: Pt is asking that you call him regarding his lab results. He said that you had personally called him and left a message. Initial call taken by: Lowella Petties CMA,  June 15, 2010 10:08 AM  Follow-up for Phone Call        Called Mr. Lonzo.  Taking Vit D3 OTC 1000IU every other day.  recommended take daily, changed accordingly in chart.  Taking synthroid daily.  Rec increase to daily (new script sent.).  Rec better control of sugars, start with increasing metformin as discussed, may need to go up to 2 pills in am 2 pills in pm.  Will further discuss at next visit in 1 month. Discussed UA level, monitor for now.   Follow-up by: [name] Boyden  MD,  June 15, 2010 12:25 PM

## 2010-12-06 NOTE — Progress Notes (Signed)
Summary: Percocet and Alprazolam  Phone Note Refill Request Call back at Home Phone [phone] Message from:  Patient on February 03, 2010 10:15 AM  Refills Requested: Medication #1:  PERCOCET 5-325 MG TABS 0ne to two  tabs  by mouth every six hrs as needed for pain  Medication #2:  ALPRAZOLAM 1 MG TABS one tab at night as needed for anxiety Please call patient when ready for pick up    Method Requested: Pick up at Office Initial call taken by: Melody Comas,  February 03, 2010 10:15 AM  Follow-up for Phone Call        Patient notified that rx is ready to be picked up. Rxs left at front.  Follow-up by: Melody Comas,  February 03, 2010 10:30 AM    Prescriptions: ALPRAZOLAM 1 MG TABS (ALPRAZOLAM) one tab at night as needed for anxiety  #30 x 0   Entered and Authorized by:   Shaune Leeks MD   Signed by:   Shaune Leeks MD on 02/03/2010   Method used:   Print then Give to Patient   RxID:   [id number] PERCOCET 5-325 MG TABS (OXYCODONE-ACETAMINOPHEN) 0ne to two  tabs  by mouth every six hrs as needed for pain  #150 x 0   Entered and Authorized by:   Shaune Leeks MD   Signed by:   Shaune Leeks MD on 02/03/2010   Method used:   Print then Give to Patient   RxID:   [id number]

## 2010-12-06 NOTE — Letter (Signed)
Summary: [name] letter  [place] [facility]  [address] [name], [place] [address]   Phone: [phone]  Fax: [phone]       06/09/2010 MRN: [id number]  [facility] [place] [address], [place]  [address]  Dear Mr. [name] [place], and [place] announce the retirement of Arta Silence, M.D., from full-time practice at the [facility] office effective May 05, 2010 and his plans of returning part-time.  It is important to Dr. [name] and to our practice that you understand that [facility] has seven physicians in our office for your health care needs.  We will continue to offer the same exceptional care that you have today.    Dr. [name] has spoken to many of you about his plans for retirement and returning part-time in the fall.   We will continue to work with you through the transition to schedule appointments for you in the office and meet the high standards that [name] is committed to.   Again, it is with great pleasure that we share the news that Dr. [name] will return to [facility] at [facility] in October of 2011 with a reduced schedule.    If you have any questions, or would like to request an appointment with one of our physicians, please call us at [phone] and press the option for Scheduling an appointment.  We take pleasure in providing you with excellent patient care and look forward to seeing you at your next office visit.  Our [facility] Physicians are:  [name], M.D. [name], M.D. [name], M.D. [name], M.D. [name], M.D. [name], M.D. We proudly welcomed [name], M.D. and [name], M.D. to the practice in July/August 2011.  Sincerely,   [facility]

## 2010-12-06 NOTE — Letter (Signed)
Summary: PHQ-9 = 17  Depression Questionnaire   Imported By: Lanelle Bal 08/25/2010 09:26:21  _____________________________________________________________________  External Attachment:    Type:   Image     Comment:   External Document
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
## 2010-12-06 NOTE — Progress Notes (Signed)
Summary: needs refills on percocet, xanax  Phone Note Refill Request Call back at Home Phone [phone] Message from:  Patient  Refills Requested: Medication #1:  PERCOCET 5-325 MG TABS 0ne to two  tabs  by mouth every six hrs as needed for pain  Medication #2:  ALPRAZOLAM 1 MG TABS one tab at night as needed for anxiety Please call pt when ready.  Initial call taken by: Lowella Petties CMA,  January 04, 2010 9:35 AM  Follow-up for Phone Call        Patient notified, Rx's ready for pick up will be at front desk. Follow-up by: Linde Gillis CMA Duncan Dull),  January 04, 2010 2:49 PM    Prescriptions: ALPRAZOLAM 1 MG TABS (ALPRAZOLAM) one tab at night as needed for anxiety  #30 x 0   Entered and Authorized by:   Shaune Leeks MD   Signed by:   Shaune Leeks MD on 01/04/2010   Method used:   Print then Give to Patient   RxID:   [id number] PERCOCET 5-325 MG TABS (OXYCODONE-ACETAMINOPHEN) 0ne to two  tabs  by mouth every six hrs as needed for pain  #150 x 0   Entered and Authorized by:   Shaune Leeks MD   Signed by:   Shaune Leeks MD on 01/04/2010   Method used:   Print then Give to Patient   RxID:   [id number]

## 2010-12-06 NOTE — Progress Notes (Signed)
Summary: refill requests for xanax and percocet  Phone Note Refill Request Call back at Home Phone [phone] Message from:  Patient  Refills Requested: Medication #1:  PERCOCET 5-325 MG TABS 0ne to two  tabs  by mouth every six hrs as needed for pain  Medication #2:  ALPRAZOLAM 1 MG TABS one tab at night as needed for anxiety Please call pt when ready.  Initial call taken by: Lowella Petties CMA,  Mar 07, 2010 9:24 AM  Follow-up for Phone Call        Script placed up front for pick up. Follow-up by: Lowella Petties CMA,  Mar 07, 2010 2:15 PM    Prescriptions: ALPRAZOLAM 1 MG TABS (ALPRAZOLAM) one tab at night as needed for anxiety  #30 x 0   Entered and Authorized by:   Shaune Leeks MD   Signed by:   Shaune Leeks MD on 03/07/2010   Method used:   Print then Give to Patient   RxID:   [id number] PERCOCET 5-325 MG TABS (OXYCODONE-ACETAMINOPHEN) 0ne to two  tabs  by mouth every six hrs as needed for pain  #150 x 0   Entered and Authorized by:   Shaune Leeks MD   Signed by:   Shaune Leeks MD on 03/07/2010   Method used:   Print then Give to Patient   RxID:   [id number]

## 2010-12-06 NOTE — Progress Notes (Signed)
Summary: refill requests for percocet, xanax  Phone Note Refill Request Message from:  Patient  Refills Requested: Medication #1:  PERCOCET 5-325 MG TABS 0ne to two  tabs  by mouth every six hrs as needed for pain  Medication #2:  ALPRAZOLAM 1 MG TABS one tab at night as needed for anxiety Please call pt when ready, he would like to pick these up tomorrow.  Initial call taken by: Lowella Petties CMA,  July 07, 2010 2:30 PM  Follow-up for Phone Call        done. routed to [employer]. Follow-up by: [name]  MD,  July 07, 2010 3:11 PM  Additional Follow-up for Phone Call Additional follow up Details #1::        patient notified and Rx's up front for pick up Additional Follow-up by: Janee Morn CMA Duncan Dull),  July 07, 2010 3:57 PM    Prescriptions: PERCOCET 5-325 MG TABS (OXYCODONE-ACETAMINOPHEN) 0ne to two  tabs  by mouth every six hrs as needed for pain  #150 x 0   Entered and Authorized by:   [name]  MD   Signed by:   [name]  MD on 07/07/2010   Method used:   Print then Give to Patient   RxID:   [id number] ALPRAZOLAM 1 MG TABS (ALPRAZOLAM) one tab at night as needed for anxiety  #30 x 0   Entered and Authorized by:   [name]  MD   Signed by:   [name]  MD on 07/07/2010   Method used:   Print then Give to Patient   RxID:   [id number]

## 2010-12-06 NOTE — Progress Notes (Signed)
Summary: Percocet and Alprazolam  Phone Note Refill Request Message from:  Patient on May 02, 2010 10:17 AM  Refills Requested: Medication #1:  PERCOCET 5-325 MG TABS 0ne to two  tabs  by mouth every six hrs as needed for pain  Medication #2:  ALPRAZOLAM 1 MG TABS one tab at night as needed for anxiety Please call patient when prescription is ready for pickup.    Method Requested: Pick up at Office Initial call taken by: Delilah Shan CMA Duncan Dull),  May 02, 2010 10:17 AM  Follow-up for Phone Call        Patient Advised. Prescription left at front desk.  Follow-up by: Delilah Shan CMA Duncan Dull),  May 02, 2010 2:08 PM    Prescriptions: ALPRAZOLAM 1 MG TABS (ALPRAZOLAM) one tab at night as needed for anxiety  #30 x 0   Entered and Authorized by:   Shaune Leeks MD   Signed by:   Shaune Leeks MD on 05/02/2010   Method used:   Print then Give to Patient   RxID:   [id number] PERCOCET 5-325 MG TABS (OXYCODONE-ACETAMINOPHEN) 0ne to two  tabs  by mouth every six hrs as needed for pain  #150 x 0   Entered and Authorized by:   Shaune Leeks MD   Signed by:   Shaune Leeks MD on 05/02/2010   Method used:   Print then Give to Patient   RxID:   [id number]

## 2010-12-06 NOTE — Medication Information (Signed)
Summary: [facility]  [facility]   Imported By: Lanelle Bal 05/17/2010 09:28:35  _____________________________________________________________________  External Attachment:    Type:   Image     Comment:   External Document

## 2010-12-06 NOTE — Progress Notes (Signed)
Summary: rx for alprazolam, percocet  Phone Note Refill Request Call back at Home Phone [phone] Message from:  Patient on November 08, 2009 3:04 PM  Refills Requested: Medication #1:  PERCOCET 5-325 MG TABS 0ne to two  tabs  by mouth every six hrs as needed for pain  Medication #2:  ALPRAZOLAM 1 MG TABS one tab at night as needed for anxiety Please call patient when ready for pick up. Wants to pick up this afternoon.    Method Requested: Pick up at Office Initial call taken by: Melody Comas,  November 08, 2009 3:07 PM  Follow-up for Phone Call        Please insure he knows there is a 24 hr turnaround for refills. Follow-up by: Shaune Leeks MD,  November 08, 2009 3:50 PM  Additional Follow-up for Phone Call Additional follow up Details #1::        Called pt to let him know that rx was ready for pick up. Also reminded him that in the future to allow 24 hrs for written rx.  Additional Follow-up by: Melody Comas,  November 08, 2009 4:01 PM    Prescriptions: ALPRAZOLAM 1 MG TABS (ALPRAZOLAM) one tab at night as needed for anxiety  #30 x 0   Entered and Authorized by:   Shaune Leeks MD   Signed by:   Shaune Leeks MD on 11/08/2009   Method used:   Print then Give to Patient   RxID:   [id number] PERCOCET 5-325 MG TABS (OXYCODONE-ACETAMINOPHEN) 0ne to two  tabs  by mouth every six hrs as needed for pain  #150 x 0   Entered and Authorized by:   Shaune Leeks MD   Signed by:   Shaune Leeks MD on 11/08/2009   Method used:   Print then Give to Patient   RxID:   [id number]
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
## 2010-12-06 NOTE — Letter (Signed)
Summary: [facility]   Imported By: Sherian Rein 09/06/2010 09:44:06  _____________________________________________________________________  External Attachment:    Type:   Image     Comment:   External Document
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
## 2010-12-06 NOTE — Letter (Signed)
Summary: [facility]   Imported By: Lanelle Bal 09/06/2010 15:13:59  _____________________________________________________________________  External Attachment:    Type:   Image     Comment:   External Document

## 2010-12-06 NOTE — Progress Notes (Signed)
Summary: refill request for percocet, xanax  Phone Note Refill Request Call back at Home Phone [phone] Message from:  Patient  Refills Requested: Medication #1:  PERCOCET 5-325 MG TABS 0ne to two  tabs  by mouth every six hrs as needed for pain  Medication #2:  ALPRAZOLAM 1 MG TABS one tab at night as needed for anxiety Pt is requesting 3 seperate scripts for each one.  Please call when ready.  Initial call taken by: [name] CMA, AAMA,  September 06, 2010 10:33 AM  Follow-up for Phone Call        please clarify 3 separate scripts- only 2 meds on refill request Follow-up by: [name] Boyden  MD,  September 06, 2010 12:22 PM  Additional Follow-up for Phone Call Additional follow up Details #1::        The request is for the two listed meds. He said he had talked to you about post dating scripts due to the distance he has to travel to pick them up and having the pharmacy hold them. He was requesting a script for November, December and January.  Additional Follow-up by: [name] CMA Duncan Dull),  September 06, 2010 1:42 PM    Additional Follow-up for Phone Call Additional follow up Details #2::    ok thanks. Pt requesting advanced scripts because he lives  ~45 min away, more convenient.  Has always taken medications as prescribed.  [name] Boyden  MD  September 06, 2010 1:45 PM   done.  Follow-up by: [name] Boyden  MD,  September 07, 2010 8:46 AM  Additional Follow-up for Phone Call Additional follow up Details #3:: Details for Additional Follow-up Action Taken: Called patient and advised that Rx was placed up front for pick up. I did advise that if prescriptions were lost or misplaced they could not be rewritten due to being controlled substances. He verbalized understanding and would make sure the pharmacy took good care of them.  Additional Follow-up by: [name] CMA Duncan Dull),  September 07, 2010 10:01 AM  New/Updated Medications: ALPRAZOLAM 1 MG TABS (ALPRAZOLAM) one tab at  night as needed for anxiety FILL AFTER 09/06/2010 ALPRAZOLAM 1 MG TABS (ALPRAZOLAM) one tab at night as needed for anxiety FILL AFTER 10/06/2010 ALPRAZOLAM 1 MG TABS (ALPRAZOLAM) one tab at night as needed for anxiety FILL AFTER 11/06/2010 PERCOCET 5-325 MG TABS (OXYCODONE-ACETAMINOPHEN) 0ne to two  tabs  by mouth every six hrs as needed for pain, FILL AFTER 09/06/2010 PERCOCET 5-325 MG TABS (OXYCODONE-ACETAMINOPHEN) 0ne to two  tabs  by mouth every six hrs as needed for pain, FILL AFTER 10/06/2010 PERCOCET 5-325 MG TABS (OXYCODONE-ACETAMINOPHEN) 0ne to two  tabs  by mouth every six hrs as needed for pain, FILL AFTER 11/06/2010 Prescriptions: PERCOCET 5-325 MG TABS (OXYCODONE-ACETAMINOPHEN) 0ne to two  tabs  by mouth every six hrs as needed for pain, FILL AFTER 11/06/2010  #150 x 0   Entered and Authorized by:   [name] Boyden  MD   Signed by:   [name] Boyden  MD on 09/07/2010   Method used:   Print then Give to Patient   RxID:   [id number] PERCOCET 5-325 MG TABS (OXYCODONE-ACETAMINOPHEN) 0ne to two  tabs  by mouth every six hrs as needed for pain, FILL AFTER 10/06/2010  #150 x 0   Entered and Authorized by:   [name] Boyden  MD   Signed by:   [name] Boyden  MD on 09/07/2010   Method used:  Print then Give to Patient   RxID:   [id number] PERCOCET 5-325 MG TABS (OXYCODONE-ACETAMINOPHEN) 0ne to two  tabs  by mouth every six hrs as needed for pain, FILL AFTER 09/06/2010  #150 x 0   Entered and Authorized by:   [name] Boyden  MD   Signed by:   [name] Boyden  MD on 09/07/2010   Method used:   Print then Give to Patient   RxID:   [id number] ALPRAZOLAM 1 MG TABS (ALPRAZOLAM) one tab at night as needed for anxiety FILL AFTER 11/06/2010  #30 x 0   Entered and Authorized by:   [name] Boyden  MD   Signed by:   [name] Boyden  MD on 09/07/2010   Method used:   Print then Give to Patient   RxID:   [id number] ALPRAZOLAM 1 MG TABS (ALPRAZOLAM) one tab at night as needed  for anxiety FILL AFTER 10/06/2010  #30 x 0   Entered and Authorized by:   [name] Boyden  MD   Signed by:   [name] Boyden  MD on 09/07/2010   Method used:   Print then Give to Patient   RxID:   [id number] ALPRAZOLAM 1 MG TABS (ALPRAZOLAM) one tab at night as needed for anxiety FILL AFTER 09/06/2010  #30 x 0   Entered and Authorized by:   [name] Boyden  MD   Signed by:   [name] Boyden  MD on 09/07/2010   Method used:   Print then Give to Patient   RxID:   [id number]

## 2010-12-06 NOTE — Progress Notes (Signed)
Summary: percocet and xanax   Phone Note Refill Request Call back at Home Phone [phone] Message from:  Patient on August 08, 2010 9:50 AM  Refills Requested: Medication #1:  PERCOCET 5-325 MG TABS 0ne to two  tabs  by mouth every six hrs as needed for pain  Medication #2:  ALPRAZOLAM 1 MG TABS one tab at night as needed for anxiety  Method Requested: Pick up at Office Initial call taken by: Melody Comas,  August 08, 2010 9:50 AM  Follow-up for Phone Call        please telephone in and then call patient. Follow-up by: [name]  MD,  August 08, 2010 10:26 AM  Additional Follow-up for Phone Call Additional follow up Details #1::        Xanax called in as directed. Percocet will have to be written and signed for patient to pick up.  Additional Follow-up by: Janee Morn CMA Duncan Dull),  August 08, 2010 10:40 AM    Additional Follow-up for Phone Call Additional follow up Details #2::    left for Kim. Follow-up by: [name]  MD,  August 08, 2010 1:24 PM  Additional Follow-up for Phone Call Additional follow up Details #3:: Details for Additional Follow-up Action Taken: Patient aware and Rx up front for pick up Additional Follow-up by: Janee Morn CMA Duncan Dull),  August 08, 2010 1:33 PM  Prescriptions: PERCOCET 5-325 MG TABS (OXYCODONE-ACETAMINOPHEN) 0ne to two  tabs  by mouth every six hrs as needed for pain  #150 x 0   Entered and Authorized by:   [name]  MD   Signed by:   [name]  MD on 08/08/2010   Method used:   Print then Give to Patient   RxID:   [id number] ALPRAZOLAM 1 MG TABS (ALPRAZOLAM) one tab at night as needed for anxiety  #30 x 0   Entered and Authorized by:   [name]  MD   Signed by:   [name]  MD on 08/08/2010   Method used:   Telephoned to ...       [facility] [phone]* (retail)       [address]       [place], [place]  [address]       Ph: [phone]       Fax: [phone]   RxID:    [id number] PERCOCET 5-325 MG TABS (OXYCODONE-ACETAMINOPHEN) 0ne to two  tabs  by mouth every six hrs as needed for pain  #150 x 0   Entered and Authorized by:   [name]  MD   Signed by:   [name]  MD on 08/08/2010   Method used:   Telephoned to ...       [facility] [phone]* (retail)       [address]       [place], [place]  [address]       Ph: [phone]       Fax: [phone]   RxID:   [id number]

## 2010-12-06 NOTE — Progress Notes (Signed)
Summary: Rx Percocet and Alprazolam  Phone Note Call from Patient   Caller: Patient Call For: [name] Leeks MD Summary of Call: Patient has an appt. scheduled on 06/13/10. Patient states that he is out of his pain medication and is hurting real bad. Patient wants a refill on his Percocet and Alprazolam. Call when ready for pickup. Initial call taken by: Sydell Axon LPN,  June 06, 2010 12:10 PM  Follow-up for Phone Call        done Follow-up by: [name]  MD,  June 06, 2010 1:08 PM  Additional Follow-up for Phone Call Additional follow up Details #1::        Patient called and Rx placed up front for pick up Additional Follow-up by: Janee Morn CMA,  June 06, 2010 1:46 PM    Prescriptions: ALPRAZOLAM 1 MG TABS (ALPRAZOLAM) one tab at night as needed for anxiety  #30 x 0   Entered and Authorized by:   [name]  MD   Signed by:   [name]  MD on 06/06/2010   Method used:   Print then Give to Patient   RxID:   [id number] PERCOCET 5-325 MG TABS (OXYCODONE-ACETAMINOPHEN) 0ne to two  tabs  by mouth every six hrs as needed for pain  #150 x 0   Entered and Authorized by:   [name]  MD   Signed by:   [name]  MD on 06/06/2010   Method used:   Print then Give to Patient   RxID:   [id number]
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
## 2010-12-08 NOTE — Progress Notes (Signed)
Summary: refill requests for alprazolam, percocet  Phone Note Refill Request Call back at Home Phone [phone] Message from:  Patient  Refills Requested: Medication #1:  ALPRAZOLAM 1 MG TABS one tab at night as needed for anxiety FILL AFTER 09/06/2010  Medication #2:  PERCOCET 5-325 MG TABS 0ne to two  tabs  by mouth every six hrs as needed for pain Please call pt when ready.  He is asking for for 3 seperate scripts on the percocet, so that he doesnt have to come in so often.  Initial call taken by: Lowella Petties CMA, AAMA,  November 08, 2010 8:33 AM  Follow-up for Phone Call        done and routed to kim Follow-up by: [name]  MD,  November 08, 2010 1:58 PM  Additional Follow-up for Phone Call Additional follow up Details #1::        Patient notified and Rx placed up front for pick up. Additional Follow-up by: Janee Morn CMA Duncan Dull),  November 08, 2010 2:47 PM    New/Updated Medications: ALPRAZOLAM 1 MG TABS (ALPRAZOLAM) one tab at night as needed for anxiety FILL AFTER 12/07/2010 ALPRAZOLAM 1 MG TABS (ALPRAZOLAM) one tab at night as needed for anxiety FILL AFTER 01/05/2011 ALPRAZOLAM 1 MG TABS (ALPRAZOLAM) one tab at night as needed for anxiety FILL AFTER 02/05/2011 PERCOCET 5-325 MG TABS (OXYCODONE-ACETAMINOPHEN) 0ne to two  tabs  by mouth every six hrs as needed for pain, FILL AFTER 12/07/2010 PERCOCET 5-325 MG TABS (OXYCODONE-ACETAMINOPHEN) 0ne to two  tabs  by mouth every six hrs as needed for pain, FILL AFTER 01/05/2011 PERCOCET 5-325 MG TABS (OXYCODONE-ACETAMINOPHEN) 0ne to two  tabs  by mouth every six hrs as needed for pain, FILL AFTER 02/05/2011 Prescriptions: ALPRAZOLAM 1 MG TABS (ALPRAZOLAM) one tab at night as needed for anxiety FILL AFTER 02/05/2011  #30 x 0   Entered and Authorized by:   [name]  MD   Signed by:   [name]  MD on 11/08/2010   Method used:   Print then Give to Patient   RxID:   [id number] ALPRAZOLAM 1 MG TABS (ALPRAZOLAM) one tab at  night as needed for anxiety FILL AFTER 01/05/2011  #30 x 0   Entered and Authorized by:   [name]  MD   Signed by:   [name]  MD on 11/08/2010   Method used:   Print then Give to Patient   RxID:   [id number] ALPRAZOLAM 1 MG TABS (ALPRAZOLAM) one tab at night as needed for anxiety FILL AFTER 12/07/2010  #30 x 0   Entered and Authorized by:   [name]  MD   Signed by:   [name]  MD on 11/08/2010   Method used:   Print then Give to Patient   RxID:   [id number] PERCOCET 5-325 MG TABS (OXYCODONE-ACETAMINOPHEN) 0ne to two  tabs  by mouth every six hrs as needed for pain, FILL AFTER 02/05/2011  #150 x 0   Entered and Authorized by:   [name]  MD   Signed by:   [name]  MD on 11/08/2010   Method used:   Print then Give to Patient   RxID:   [id number] PERCOCET 5-325 MG TABS (OXYCODONE-ACETAMINOPHEN) 0ne to two  tabs  by mouth every six hrs as needed for pain, FILL AFTER 01/05/2011  #150 x 0   Entered and Authorized by:   [name]  MD   Signed by:  [name]  MD on 11/08/2010   Method used:   Print then Give to Patient   RxID:   [id number] PERCOCET 5-325 MG TABS (OXYCODONE-ACETAMINOPHEN) 0ne to two  tabs  by mouth every six hrs as needed for pain, FILL AFTER 12/07/2010  #150 x 0   Entered and Authorized by:   [name]  MD   Signed by:   [name]  MD on 11/08/2010   Method used:   Print then Give to Patient   RxID:   [id number]   Appended Document: refill requests for alprazolam, percocet Medications Added ALPRAZOLAM 1 MG TABS (ALPRAZOLAM) one tab at night as needed for anxiety FILL AFTER 11/06/2010 PERCOCET 5-325 MG TABS (OXYCODONE-ACETAMINOPHEN) 0ne to two  tabs  by mouth every six hrs as needed for pain, FILL AFTER 11/06/2010          Clinical Lists Changes  Medications: Changed medication from ALPRAZOLAM 1 MG TABS (ALPRAZOLAM) one tab at night as needed for anxiety FILL AFTER 12/07/2010  to ALPRAZOLAM 1 MG TABS (ALPRAZOLAM) one tab at night as needed for anxiety FILL AFTER 11/06/2010 - Signed Changed medication from PERCOCET 5-325 MG TABS (OXYCODONE-ACETAMINOPHEN) 0ne to two  tabs  by mouth every six hrs as needed for pain, FILL AFTER 12/07/2010 to PERCOCET 5-325 MG TABS (OXYCODONE-ACETAMINOPHEN) 0ne to two  tabs  by mouth every six hrs as needed for pain, FILL AFTER 11/06/2010 - Signed Rx of ALPRAZOLAM 1 MG TABS (ALPRAZOLAM) one tab at night as needed for anxiety FILL AFTER 11/06/2010;  #30 x 0;  Signed;  Entered by: [name]  MD;  Authorized by: [name]  MD;  Method used: Print then Give to Patient Rx of PERCOCET 5-325 MG TABS (OXYCODONE-ACETAMINOPHEN) 0ne to two  tabs  by mouth every six hrs as needed for pain, FILL AFTER 11/06/2010;  #150 x 0;  Signed;  Entered by: [name]  MD;  Authorized by: [name]  MD;  Method used: Print then Give to Patient    Prescriptions: PERCOCET 5-325 MG TABS (OXYCODONE-ACETAMINOPHEN) 0ne to two  tabs  by mouth every six hrs as needed for pain, FILL AFTER 11/06/2010  #150 x 0   Entered and Authorized by:   [name]  MD   Signed by:   [name]  MD on 11/08/2010   Method used:   Print then Give to Patient   RxID:   [id number] ALPRAZOLAM 1 MG TABS (ALPRAZOLAM) one tab at night as needed for anxiety FILL AFTER 11/06/2010  #30 x 0   Entered and Authorized by:   [name]  MD   Signed by:   [name]  MD on 11/08/2010   Method used:   Print then Give to Patient   RxID:   [id number]   [facility] says [facility] filled old prescription dated 09/07/2010 that said" do not fill until after 11/06/2010".  above prescription was in excess.  called pharmacy and asked to cancel redundant script above. [name]  MD  November 09, 2010 2:10 PM
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
## 2011-01-04 ENCOUNTER — Encounter: Payer: Self-pay | Admitting: Family Medicine

## 2011-01-06 ENCOUNTER — Telehealth: Payer: Self-pay | Admitting: Family Medicine

## 2011-01-17 NOTE — Progress Notes (Signed)
Summary: refill request for alprazolam  Phone Note Refill Request Message from:  Fax from Pharmacy  Refills Requested: Medication #1:  ALPRAZOLAM 1 MG TABS one every other day then as needed.   FILL AFTER 02/05/2011   Last Refilled: 12/08/2010 Faxed request from [employer], phone [phone].  Initial call taken by: Lowella Petties CMA, AAMA,  January 06, 2011 11:27 AM  Follow-up for Phone Call        please call in.  did we ever hear back from social services? Follow-up by: [name]  MD,  January 06, 2011 1:16 PM  Additional Follow-up for Phone Call Additional follow up Details #1::        Rx called in as directed. No, I never heard back from them. I called and left another message for them. Hopefully they will call me back. Additional Follow-up by: Janee Morn CMA Duncan Dull),  January 06, 2011 3:45 PM    Additional Follow-up for Phone Call Additional follow up Details #2::    thanks. can we call and notify patient.?   Follow-up by: [name]  MD,  January 06, 2011 4:10 PM  Additional Follow-up for Phone Call Additional follow up Details #3:: Details for Additional Follow-up Action Taken: I have placed a call to [employer] which is the [facility] medical assistance program. I am waiting on them to return my call. Kim Dance CMA Duncan Dull)  January 09, 2011 3:07 PM   I spoke with Waldron Session at [place]. She advised that they 'may' be able to assist him. She wanted him to call and talk to her about the application process. She did adivse they couldn't help with controlled meds. They are able to help most people with their "medical" meds (diabetes, BP, Thyroid, etc). She also said they are backlogged x10 weeks, so it may be a while before he can officially get the assistance, but she would definitely do her best to get things in the works. I spoke with paitent and notified him. He said he would call her tomorrow. Kim Dance CMA Duncan Dull)  January 09, 2011 4:30 PM    appreciate it, thanks! Additional  Follow-up by: [name]  MD,  January 09, 2011 5:09 PM  New/Updated Medications: ALPRAZOLAM 1 MG TABS (ALPRAZOLAM) one as needed for anxiety Prescriptions: ALPRAZOLAM 1 MG TABS (ALPRAZOLAM) one as needed for anxiety  #30 x 0   Entered and Authorized by:   [name]  MD   Signed by:   [name]  MD on 01/09/2011   Method used:   [facility]phoned to ...       [phone][facility] (retail)       [address]       [place], [place]  [address]       Ph: [phone]       Fax: [phone]   RxID:   [id number]

## 2011-02-20 ENCOUNTER — Ambulatory Visit (INDEPENDENT_AMBULATORY_CARE_PROVIDER_SITE_OTHER): Payer: Self-pay | Admitting: Family Medicine

## 2011-02-20 ENCOUNTER — Encounter: Payer: Self-pay | Admitting: Family Medicine

## 2011-02-20 DIAGNOSIS — M545 Low back pain: Secondary | ICD-10-CM

## 2011-02-20 DIAGNOSIS — E119 Type 2 diabetes mellitus without complications: Secondary | ICD-10-CM

## 2011-02-20 DIAGNOSIS — I10 Essential (primary) hypertension: Secondary | ICD-10-CM

## 2011-02-20 DIAGNOSIS — L989 Disorder of the skin and subcutaneous tissue, unspecified: Secondary | ICD-10-CM | POA: Insufficient documentation

## 2011-02-20 MED ORDER — OXYCODONE-ACETAMINOPHEN 5-325 MG PO TABS: 2.0000 | ORAL_TABLET | Freq: Four times a day (QID) | ORAL | Status: DC | PRN

## 2011-02-20 MED ORDER — ALPRAZOLAM 1 MG PO TABS: 1.0000 mg | ORAL_TABLET | Freq: Every evening | ORAL | Status: DC | PRN

## 2011-02-20 NOTE — Assessment & Plan Note (Signed)
Not optimal control but pt in pain.  Recommend continued meds for now.  F/u 3 wks.

## 2011-02-20 NOTE — Assessment & Plan Note (Signed)
Pain not well controlled currently. Cannot afford pain clinic referral.  Doesn't think can afford long acting narcotics. Continue percocets for now, monitor, return in 3 wks for followup.

## 2011-02-20 NOTE — Assessment & Plan Note (Signed)
Due for blood work, recheck next visit.   States sugars running well on change from humulin to novolin.

## 2011-02-20 NOTE — Assessment & Plan Note (Signed)
No ulcer currently, covered with new skin.   Recommend slight vaseline and keep covered. Update if worsening.

## 2011-02-20 NOTE — Progress Notes (Signed)
  Subjective:    Patient ID: [name], male    DOB: 02/09/1946, 65 y.o.   MRN: [id number]  HPI CC: f/u DM, stressors  Overall feeling well, however does have significant stressors.  1. DM - average fasting sugars 170s.  + numbness R hand occasionally.  Changed from humulin (70/30 and sliding scale) to novolin 70/30 and sliding scale (able to get cheaper from [place], $20 vs $90 of humulin per month).  happy about this.  Appreciative of Kim setting him up with this.  2. Pain - had 19 percocets left from last month, down to 10 percocets.  Attributes to change in weather.  + R hand numbness as well as back and leg pain, chronic.  Nerves shot, will run out of xanax soon.  Chronic narcotic therapy since prior to 2008.  Interested in better pain control in form of longacting narcotics however not if cost of meds will increase.  3. Feet - aching and burning, place on side of R foot with ulcer per patient.  Thinks will need to f/u with Dr. Al Corpus, podiatrist.  Lots of stress - lots of trouble with ex wife.  Financial difficulties as well with doctors and hospitals/bills.  Due for prostate check with urologist.  States doesn't need med refills.  Medications and allergies reviewed and updated as above. PMhx reviewed for relevance.  Review of Systems Per HPI    Objective:   Physical Exam  Vitals reviewed. Constitutional: He appears well-developed and well-nourished. No distress.  HENT:  Head: Normocephalic and atraumatic.  Mouth/Throat: Oropharynx is clear and moist. No oropharyngeal exudate.  Eyes: Conjunctivae and EOM are normal. Pupils are equal, round, and reactive to light. No scleral icterus.  Neck: Normal range of motion. Neck supple. Carotid bruit is not present.  Cardiovascular: Normal rate, regular rhythm, normal heart sounds and intact distal pulses.   No murmur heard. Pulmonary/Chest: Breath sounds normal. No respiratory distress. He has no wheezes. He has no rales.    Lymphadenopathy:    He has no cervical adenopathy.  Skin: Skin is warm and dry. No rash noted.      Diabetic foot exam: Thickened skin, CVI changes bilateral feet. Spot R medial forefoot with what seems like old ulcer but new skin underneath. No calluses Diminished DP/PT pulses Diminished sensation to light tough and monofilament Nails thickened.  Assessment & Plan:

## 2011-02-20 NOTE — Patient Instructions (Addendum)
Pass by [facility]'s office to see if we can set you up with financial assistance. Keep your head up. Follow up with me in 3 weeks (30 min appt). For R foot - keep covered, add vaseline to keep moisturized. Bring your log and all insulins next visit to review.

## 2011-02-21 ENCOUNTER — Encounter: Payer: Self-pay | Admitting: Family Medicine

## 2011-03-01 ENCOUNTER — Other Ambulatory Visit: Payer: Self-pay | Admitting: Family Medicine

## 2011-03-13 ENCOUNTER — Ambulatory Visit (INDEPENDENT_AMBULATORY_CARE_PROVIDER_SITE_OTHER): Payer: Medicare Other | Admitting: Family Medicine

## 2011-03-13 ENCOUNTER — Encounter: Payer: Self-pay | Admitting: Family Medicine

## 2011-03-13 VITALS — BP 132/68 | HR 72 | Temp 98.4°F | Wt >= 6400 oz

## 2011-03-13 DIAGNOSIS — E039 Hypothyroidism, unspecified: Secondary | ICD-10-CM

## 2011-03-13 DIAGNOSIS — F322 Major depressive disorder, single episode, severe without psychotic features: Secondary | ICD-10-CM

## 2011-03-13 DIAGNOSIS — Z87891 Personal history of nicotine dependence: Secondary | ICD-10-CM

## 2011-03-13 DIAGNOSIS — E11339 Type 2 diabetes mellitus with moderate nonproliferative diabetic retinopathy without macular edema: Secondary | ICD-10-CM

## 2011-03-13 DIAGNOSIS — E119 Type 2 diabetes mellitus without complications: Secondary | ICD-10-CM

## 2011-03-13 DIAGNOSIS — E785 Hyperlipidemia, unspecified: Secondary | ICD-10-CM

## 2011-03-13 DIAGNOSIS — E559 Vitamin D deficiency, unspecified: Secondary | ICD-10-CM

## 2011-03-13 DIAGNOSIS — I10 Essential (primary) hypertension: Secondary | ICD-10-CM

## 2011-03-13 MED ORDER — TRAZODONE HCL 50 MG PO TABS: 50.0000 mg | ORAL_TABLET | Freq: Every day | ORAL | Status: DC

## 2011-03-13 MED ORDER — ALPRAZOLAM 1 MG PO TABS: 1.0000 mg | ORAL_TABLET | Freq: Every evening | ORAL | Status: DC | PRN

## 2011-03-13 MED ORDER — OXYCODONE-ACETAMINOPHEN 5-325 MG PO TABS: 2.0000 | ORAL_TABLET | Freq: Three times a day (TID) | ORAL | Status: DC | PRN

## 2011-03-13 MED ORDER — SERTRALINE HCL 100 MG PO TABS: 100.0000 mg | ORAL_TABLET | Freq: Every day | ORAL | Status: DC

## 2011-03-13 NOTE — Patient Instructions (Addendum)
Blood work today. Increase sertraline to 2 pills daily (total of 100mg  daily).  New prescription will be for increased dose of 100mg  daily (1 pill daily). Refilled trazodone, percocets and xanax. Insulin - take 70/30 twice daily every day, then regular with meals as correction.   Continue all other meds. Just take trazodone at night. Good to see you today.  Return in 1 months for follow up.
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
## 2011-03-14 ENCOUNTER — Encounter: Payer: Self-pay | Admitting: Family Medicine

## 2011-03-14 LAB — COMPREHENSIVE METABOLIC PANEL
ALT: 28 U/L (ref 0–53)
BUN: 13 mg/dL (ref 6–23)
CO2: 29 mEq/L (ref 19–32)
Calcium: 9.5 mg/dL (ref 8.4–10.5)
Chloride: 101 mEq/L (ref 96–112)
Creatinine, Ser: 0.9 mg/dL (ref 0.4–1.5)
GFR: 89.08 mL/min (ref >60.00)
Glucose, Bld: 132 mg/dL — ABNORMAL HIGH (ref 70–99)

## 2011-03-14 LAB — MICROALBUMIN / CREATININE URINE RATIO
Creatinine,U: 82 mg/dL
Microalb, Ur: 1.5 mg/dL (ref 0.0–1.9)

## 2011-03-14 LAB — HEMOGLOBIN A1C: Hgb A1c MFr Bld: 9.5 % — ABNORMAL HIGH (ref 4.6–6.5)

## 2011-03-14 NOTE — Assessment & Plan Note (Signed)
Deteriorated.  Increase zoloft to 100mg  daily.  (monitor abd pain as previously seemed to exacerbate). Return 1 month for f/u mood and DM.

## 2011-03-14 NOTE — Assessment & Plan Note (Signed)
Check TSH today

## 2011-03-14 NOTE — Assessment & Plan Note (Signed)
Good control on current regimen, continue meds.

## 2011-03-14 NOTE — Assessment & Plan Note (Signed)
Not controlled.  Check A1c today. Consider addition of januvia however pt with financial difficulties.

## 2011-03-14 NOTE — Assessment & Plan Note (Signed)
Advised to schedule appt with ophtho.

## 2011-03-14 NOTE — Assessment & Plan Note (Signed)
Check vit D level.  On 2000 IU daily.

## 2011-03-14 NOTE — Assessment & Plan Note (Signed)
Weight gain since last visit of 10 lbs.  Pt states no change in diet.  Encouraged continued watching diet and working on exercise.

## 2011-03-15 ENCOUNTER — Telehealth: Payer: Self-pay | Admitting: Family Medicine

## 2011-03-15 LAB — VITAMIN D 25 HYDROXY (VIT D DEFICIENCY, FRACTURES): Vit D, 25-Hydroxy: 31 ng/mL (ref 30–89)

## 2011-03-15 NOTE — Telephone Encounter (Signed)
Attempted to call patient. # has been disconnected. Will try again later.

## 2011-03-15 NOTE — Telephone Encounter (Signed)
Please notify overall blood work good.   vit D level normal, continue daily supplement. Thyroid normal, kidneys and liver normal. A1c 9.5 improved from previous level of 10.6 but still have room for improvement.  Would like him to add 500mg  at lunch of metformin.  Total dose of 2500 mg daily, 1000 mg in am, 500mg  at noon, 1000mg  in pm.  Update [place] if questions.  Can either send in 500mg  tablets to take at lunch or pt can cut 1000 pills in half.  Let me know which he decides to do.

## 2011-03-17 ENCOUNTER — Encounter: Payer: Self-pay | Admitting: *Deleted

## 2011-03-17 NOTE — Telephone Encounter (Signed)
#   still disconnected. Will mail letter notifying patient.

## 2011-03-21 NOTE — Assessment & Plan Note (Signed)
[facility] OFFICE NOTE   NAME:DAVISZeven, Kocak                       MRN:          [id number]  DATE:06/21/2007                            DOB:          [birth year]    Mr. Camerer is a 65 year old gentleman with a past medical history of  coronary disease, diabetes mellitus, hypertension, hyperlipidemia, who  presents in followup and is a preoperative evaluation prior to ankle  surgery.  The patient is status post stent placement to his LAD in 1999  at [facility].  He underwent cardiac catheterization at [facility] on September 17, 2006, secondary to recurrent chest pain.  At that time, he was found to  have an ejection fraction of 50%.  There was mild hypokinesis of the  anterior and apical walls.  He had a mid 40% lesion and a 30% diagonal  lesion.  There was a 30-40% in-stent restenosis in the previously placed  LAD stent.  There was a 95% proximal stenosis in a small caliber and  short ramus.  The circumflex had an obtuse marginal with a 95% lesion.  The PDA arose from the circumflex and had a 95% ostial stenosis which is  a small caliber artery as well.  The right coronary artery was non-  dominant and was occluded proximally with bridging right-to-right  collaterals.  It was felt that medical therapy was indicated.  Since  that time he has dyspnea with more extreme activities but not with  routine activities.  There is no orthopnea or PND but there can be mild  pedal edema which is a chronic issue.  He has not had any further chest  pain nor was there any presyncope or syncope.   MEDICATIONS:  1. Lisinopril 20 mg p.o. daily.  2. Metformin 500 mg p.o. b.i.d.  3. Crestor 10 mg p.o. daily.  4. Levothyroxine 200 mcg p.o. daily.  5. Sertraline 50 mg p.o. daily.  6. Lasix 40 mg p.o. b.i.d.  7. Insulin.   PHYSICAL EXAMINATION:  VITAL SIGNS:  Show a blood pressure of 126/72 and  his pulse is 80.  He weighs 364 pounds.  GENERAL:   He is well developed and morbidly obese.  He is in no acute  distress.  HEENT:  Normal.  NECK:  Supple with no bruits.  CHEST:  Clear.  CARDIOVASCULAR:  Reveals a regular rate and rhythm.  There is a 2/6  systolic murmur at the left sternal border.  S2 is not diminished.  ABDOMEN:  Somewhat difficult due to his obesity but there are no gross  abnormalities noted.  EXTREMITIES:  Show varicosities and trace edema bilaterally.   His electrocardiogram today shows a sinus rhythm at a rate of 80.  There  is a first AV block.  There is no RV conduction delay.  There is an  anterior infarct and a prior inferior infarct is noted as well.   DIAGNOSES:  1. Preoperative evaluation prior to ankle surgery.  The patient      underwent cardiac catheterization at [facility] in November 2007.  He did  have some obstructive disease but it was felt that medical therapy      was indicated.  He has had no symptoms since then.  We will      therefore not pursue further cardiac evaluation preoperatively.  We      would recommend continuing his present medications perioperatively.      Note, he is not on a beta-blocker most likely due to his first      degree AV block.  We would recommend resuming his aspirin following      his procedure.  His surgical risk will be somewhat higher due to      his coronary disease but I do not think we need to pursue further      cardiac evaluation prior to the procedure.  2. Hypertension.  His blood pressure is well controlled on his present      medications.  3. Hyperlipidemia.  This is being followed by Dr. [name] and he will      continue on his Crestor with a goal LDL of less than 70.  4. Diabetes mellitus.  Per Dr. [name].  5. Coronary artery disease.  He will continue on his ACE inhibitor and      Statin and he will resume his aspirin following his surgical      procedure.  Again, he is not on a beta-blocker presumably due to      his first degree AV block.  6.  Following the surgery, he will resume his exercise and he will      continue risk factor modification.   Note that he does not smoke.   We will see him back in 6 months.     Madolyn Frieze Jens Som, MD, [facility]  Electronically Signed    BSC/MedQ  DD: 06/21/2007  DT: 06/21/2007  Job #: [id number]   cc:   Arta Silence, MD  Bethann Punches

## 2011-03-24 NOTE — Assessment & Plan Note (Signed)
[facility] OFFICE NOTE   NAME:[name], [name] BREIGHNER                       MRN:          [id number]  DATE:10/24/2006                            DOB:          1946-08-09    PRIMARY CARE PHYSICIAN:  Dr. [name]   REASON:  Coronary artery disease.   HISTORY OF PRESENT ILLNESS:  I saw [name] back in November.  His  history is detailed in my previous note.  We added Imdur at his last  visit and he has been able to take this, although he is having to  quarter the tablets due to initial headache with the medication.  Despite this very low dose, he is not reporting any major problems with  angina at this time and says he has been feeling relatively well.  Today  we talked about the potential for advancing this slowly and taking the  dose in the evening if need be.  I have also encouraged him to continue  working towards weight loss and a regular walking regimen.  He saw Dr.  [name] recently but otherwise there has been no major medication  changes.   ALLERGIES:  CODEINE   PRESENT MEDICATIONS:  1. Lisinopril 20 mg p.o. daily.  2. Metoprolol 25 mg p.o. b.i.d.  3. Metformin 500 mg p.o. b.i.d.  4. Crestor 10 mg p.o. nightly.  5. Levothyroxine 200 mcg p.o. daily.  6. Sertraline 50 mg p.o. daily.  7. Furosemide 40 mg p.o. b.i.d.  8. Humulin 70/30 sliding scale.  9. Humulin R sliding scale.  10.Enteric-coated aspirin 81 mg p.o. daily.  11.Imdur 30 mg 1/4 tablet.   REVIEW OF SYSTEMS:  As per history of present illness.   PHYSICAL EXAMINATION:  Blood pressure is 124/74.  Heart rate is 73.  Weight is up to 373 from 269.  This is a morbidly obese male in no acute distress.  NECK:  Reveals no jugular pressure, no bruits. No hepatomegaly is noted.  LUNGS:  Are clear without labored breathing at rest.  CARDIAC:  Reveals a regular rate and rhythm with a soft 2/6 systolic  murmur at the base.  No S3 gallop.  EXTREMITIES:  Show chronic appearing edema below the knees that is mild.   IMPRESSION/RECOMMENDATION:  1. Coronary artery disease - status post previous anterior myocardial      infarction in 1999 treated with stent placement to the left      anterior descending at [facility].  He      has residual disease including a medically managed distal      circumflex stenosis with left and right collaterals and a 30% to      40% area of in-stent restenosis based on catheterization in      November.  He is tolerating the present medical regimen and I will      make no adjustments today.  I will plan to see him back in the next      3 months.  2. Otherwise continue regular followup with Dr. [name].  Jonelle Sidle, MD  Electronically Signed    SGM/MedQ  DD: 10/24/2006  DT: 10/24/2006  Job #: [id number]   cc:   Arta Silence, MD

## 2011-03-24 NOTE — Assessment & Plan Note (Signed)
[facility] OFFICE NOTE   NAME:DAVISKaylum, Shrum                       MRN:          [id number]  DATE:09/21/2006                            DOB:          September 20, 1946    REASON FOR VISIT:  Establish cardiology followup.   HISTORY OF PRESENT ILLNESS:  Mr. Hinojos is a pleasant 65 year old male with  morbid obesity, type II diabetes mellitus, hypertension, hyperlipidemia, and  known coronary artery disease, status post remote myocardial infarction in  November 1999 with subsequent placement of a stent in the left anterior  descending at [facility].  He has recently developed recurrent angina and  was admitted to [facility], recently discharged on November 12.  He ruled  out for myocardial infarction based on available information and underwent  cardiac catheterization.  I do have the formal cardiac catheterization  report, although the discharge summary refers to medically managed disease  including a 90-95% distal circumflex stenosis with left to right collaterals  and a 30-40% area of in-stent restenosis within the left anterior  descending.  Long acting nitrates were recommended at that time.  Mr. Yanke  has subsequently followed with Dr. [name] and is now referred to [place] today.  He has not yet started his nitrates.   At this particular time, Mr. Stuckey has exertional angina.  He has had no  prolonged episodes and also has some dyspnea on exertion.  His  electrocardiogram today shows sinus rhythm with a prolonged PR interval of  224 milliseconds.  There are small, technically nondiagnostic inferior Q-  waves, as well as anterior Q-waves consistent with prior anterior wall  myocardial infarction.  Available information does not indicate ejection  fraction.   ALLERGIES:  CODEINE.   PRESENT MEDICATIONS:  1. Enteric-coated aspirin 81 mg p.o. daily.  2. Lisinopril 20 mg p.o. daily.  3. Metoprolol 25 mg p.o.  b.i.d.  4. Metformin 500 mg p.o. b.i.d.  5. Crestor 10 mg p.o. q.h.s.  6. Levothyroxine 200 mcg p.o. daily.  7. Sertraline 50 mg p.o. daily.  8. Lasix 40 mg p.o. b.i.d.  9. Humulin 70/30 sliding scale.  10.Humulin-R sliding scale.   PAST MEDICAL HISTORY:  As outlined above.  The patient also reports prior  problems with prostate cancer in 2001.  He also has arthritic problems,  reporting prior surgery with pins in his left hip and knee.   SOCIAL HISTORY:  The patient is married.  He has 3 children.  He quit  smoking back in 2000, denies any alcohol use.  He is presently disabled.   FAMILY HISTORY:  Was reviewed and is noncontributory at this point for  premature cardiovascular disease.   REVIEW OF SYSTEMS:  As  in history of present illness.  He has had no active  bleeding problems, although does have some easy bruising on aspirin.  He  reports some urinary hesitancy and erectile dysfunction.   EXAM:  Blood pressure today is 138/78, heart rate is 66, weight is 369  pounds.  IN GENERAL:  This is a morbidly obese male  in no acute distress, without  active symptoms.  HEENT:  Conjunctiva and lids are normal.  Pharynx is clear.  NECK:  Supple, without elevated jugular venous pressure, without bruits.  No  thyromegaly is noted.  LUNGS:  Clear, without labored breathing at rest.  CARDIAC:  Reveals a regular rate and rhythm with a 2/6 systolic murmur at  the base.  Second heart sound is preserved.  No S3 gallop or pericardial rub  is evident.  ABDOMEN:  Morbidly obese, unable to adequately palpate for organomegaly.  Bowel sounds are present.  No tenderness to palpation.  EXTREMITIES:  Exhibit chronic-appearing edema below the knees bilaterally.  Distal pulses are 1+.  SKIN:  No ulcerative changes are noted, no skeletal, no kyphosis.  NEURO/PSYCHIATRIC:  The patient alert and oriented x3.   IMPRESSION/RECOMMENDATIONS:  1. History of coronary artery disease, presumably status post  previous      anterior wall myocardial infarction in 1999 with placement of a stent      in the left anterior descending at that time at [facility].  More      recently, the patient underwent repeat angiography, given recent onset      exertional angina with findings of fairly mild in-stent restenosis and      also a tight distal circumflex stenosis associated with collaterals      that was felt to be best managed medically.  I do not have information      on ejection fraction at this time.  I reviewed the patient's      medications which are actually quite good from a cardiac perspective,      and have recommended that he begin a long-acting nitrate.  I provided      him a prescription for Imdur as well as refill for sublingual      nitroglycerine.  I will plan to obtain the formal cardiac      catheterization report and see him back over the next month for symptom      review.  I have also encouraged him to make an effort at weight loss.  2. Otherwise continue regular followup with Dr. [name].  He is      apparently seen on a monthly basis.     Jonelle Sidle, MD  Electronically Signed    SGM/MedQ  DD: 09/21/2006  DT: 09/21/2006  Job #: [id number]   cc:   Arta Silence, MD

## 2011-04-06 ENCOUNTER — Other Ambulatory Visit: Payer: Self-pay | Admitting: Family Medicine

## 2011-04-06 NOTE — Telephone Encounter (Signed)
Okay to refill? 

## 2011-04-20 ENCOUNTER — Ambulatory Visit (INDEPENDENT_AMBULATORY_CARE_PROVIDER_SITE_OTHER): Payer: Medicare Other | Admitting: Family Medicine

## 2011-04-20 ENCOUNTER — Encounter: Payer: Self-pay | Admitting: Family Medicine

## 2011-04-20 VITALS — BP 132/78 | HR 80 | Temp 98.7°F | Wt 392.0 lb

## 2011-04-20 DIAGNOSIS — F322 Major depressive disorder, single episode, severe without psychotic features: Secondary | ICD-10-CM

## 2011-04-20 DIAGNOSIS — I10 Essential (primary) hypertension: Secondary | ICD-10-CM

## 2011-04-20 DIAGNOSIS — M545 Low back pain: Secondary | ICD-10-CM

## 2011-04-20 DIAGNOSIS — M79609 Pain in unspecified limb: Secondary | ICD-10-CM

## 2011-04-20 DIAGNOSIS — E119 Type 2 diabetes mellitus without complications: Secondary | ICD-10-CM

## 2011-04-20 DIAGNOSIS — M109 Gout, unspecified: Secondary | ICD-10-CM

## 2011-04-20 MED ORDER — OXYCODONE-ACETAMINOPHEN 5-325 MG PO TABS: 2.0000 | ORAL_TABLET | Freq: Three times a day (TID) | ORAL | Status: DC | PRN

## 2011-04-20 MED ORDER — MELOXICAM 15 MG PO TABS: ORAL_TABLET | ORAL | Status: DC

## 2011-04-20 NOTE — Assessment & Plan Note (Signed)
Wt Readings from Last 3 Encounters:  04/20/11 392 lb 0.6 oz (177.828 kg)  03/13/11 400 lb (181.439 kg)  02/20/11 396 lb 1.3 oz (179.661 kg)   improved with watching portion sizes and backing off sweets.  Encouraged continue.  rec start daily routine for activity.

## 2011-04-20 NOTE — Assessment & Plan Note (Signed)
Cannot afford pain clinic referral, doesn't think could afford long acting narcotics. No noted contraindications to NSAIDs, start mobic daily prn, hopeful for decreased need for narcotics with anti inflammatory on board. Will avoid steroid injection for now given DM. Lab Results  Component Value Date   CREATININE 0.9 03/13/2011
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
## 2011-04-20 NOTE — Patient Instructions (Signed)
Good to see you today. Continue diabetes meds like up to now. Great job with weight loss and diet!  Keep up the good work. Try to increase activity slowly for joint and overall health. Try meloxicam daily for pain along with percocets. Call us with questions.   Return in 3 months for follow up.

## 2011-04-20 NOTE — Assessment & Plan Note (Signed)
Improved per description of patient with change in diet and portion size control.   Continue meds as up to now.congratulated on better control, anticipate better control of A1c next visit.  rtc 3 mo. UTD eye exam.

## 2011-04-20 NOTE — Assessment & Plan Note (Signed)
Improved.  Continue zoloft at 100mg 

## 2011-04-20 NOTE — Assessment & Plan Note (Signed)
BP Readings from Last 3 Encounters:  04/20/11 132/78  03/13/11 132/68  02/20/11 138/80  not optimal control.  Continue current regimen.  Hopeful for better control with continued wt loss.

## 2011-04-20 NOTE — Assessment & Plan Note (Signed)
Very mild if any, UA 6.4 06/2010.  Continue to monitor off meds.  Consider recheck next blood draw.

## 2011-04-24 ENCOUNTER — Encounter: Payer: Self-pay | Admitting: Cardiovascular Disease

## 2011-05-08 ENCOUNTER — Other Ambulatory Visit: Payer: Self-pay | Admitting: *Deleted

## 2011-05-08 MED ORDER — METFORMIN HCL 1000 MG PO TABS: 1000.0000 mg | ORAL_TABLET | Freq: Two times a day (BID) | ORAL | Status: DC

## 2011-06-05 ENCOUNTER — Other Ambulatory Visit: Payer: Self-pay | Admitting: *Deleted

## 2011-06-06 MED ORDER — ALPRAZOLAM 1 MG PO TABS: 1.0000 mg | ORAL_TABLET | Freq: Every evening | ORAL | Status: DC | PRN

## 2011-06-06 NOTE — Telephone Encounter (Signed)
plz phone in and notify pt. 

## 2011-06-06 NOTE — Telephone Encounter (Signed)
Rx called in as directed.   

## 2011-06-24 IMAGING — NM NUCLEAR MEDICINE HEPATOHBILIARY INCLUDE GB
1 series · 8 of 8 positions shown · non-contrast
Comparison: none

REASON FOR EXAM: RUQ pain
COMMENTS:

[Series 1000: gallbladder statics · 4.80mm/px · 8 of 8 slices shown]
[im 1/8]
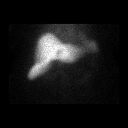
[im 2/8]
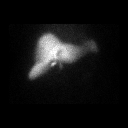
[im 3/8]
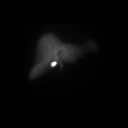
[im 4/8]
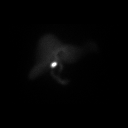
[im 5/8]
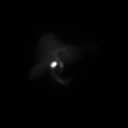
[im 6/8]
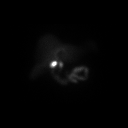
[im 7/8]
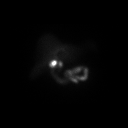
[im 8/8]
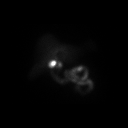

[8 of 8 positions shown; findings below may reference images not displayed]

PROCEDURE:     NM  - NM HEPATOBILIARY IMAGE  - August 23, 2010  [DATE]

RESULT:     Following intravenous administration of 8.03 mCi technetium 99m
Choletec, there is observed prompt visualization of tracer activity in the
liver at 3 minutes. At 15 minutes tracer activity is visualized in the
gallbladder, common duct and proximal small bowel.
IMPRESSION: 1.     Normal hepatobiliary scan.

## 2011-06-27 ENCOUNTER — Other Ambulatory Visit: Payer: Self-pay | Admitting: *Deleted

## 2011-06-27 MED ORDER — OXYCODONE-ACETAMINOPHEN 5-325 MG PO TABS: 2.0000 | ORAL_TABLET | Freq: Three times a day (TID) | ORAL | Status: DC | PRN

## 2011-06-27 MED ORDER — ALPRAZOLAM 1 MG PO TABS: 1.0000 mg | ORAL_TABLET | Freq: Every evening | ORAL | Status: DC | PRN

## 2011-06-27 NOTE — Telephone Encounter (Signed)
Please call pt when scripts are ready.  He would like multiple scripts for percocet, so that he doesn't have to come in as often, and refills on xanax.

## 2011-06-27 NOTE — Telephone Encounter (Signed)
Refilled xanax. Refilled oxycodone, and printed script x2 - may refill after 07/08/2011 and 08/07/2011.

## 2011-06-28 NOTE — Telephone Encounter (Signed)
Attempted to contact patient. Phone has been disconnected. Will try to notify him later.

## 2011-06-29 NOTE — Telephone Encounter (Signed)
Attempted to call patient. Phone number disconnected. Will try again later.

## 2011-06-30 NOTE — Telephone Encounter (Signed)
Advised pt that scripts are ready.  Pt can be reached at the number given under contact information.

## 2011-07-03 ENCOUNTER — Other Ambulatory Visit: Payer: Self-pay | Admitting: Family Medicine

## 2011-07-20 ENCOUNTER — Encounter: Payer: Self-pay | Admitting: Family Medicine

## 2011-07-20 ENCOUNTER — Ambulatory Visit (INDEPENDENT_AMBULATORY_CARE_PROVIDER_SITE_OTHER): Payer: Medicare Other | Admitting: Family Medicine

## 2011-07-20 VITALS — BP 124/74 | HR 76 | Temp 98.6°F | Wt 389.8 lb

## 2011-07-20 DIAGNOSIS — E119 Type 2 diabetes mellitus without complications: Secondary | ICD-10-CM

## 2011-07-20 DIAGNOSIS — I10 Essential (primary) hypertension: Secondary | ICD-10-CM

## 2011-07-20 DIAGNOSIS — E039 Hypothyroidism, unspecified: Secondary | ICD-10-CM

## 2011-07-20 MED ORDER — LEVOTHYROXINE SODIUM 25 MCG PO TABS: 25.0000 ug | ORAL_TABLET | Freq: Every day | ORAL | Status: DC

## 2011-07-20 MED ORDER — LEVOTHYROXINE SODIUM 200 MCG PO TABS: 200.0000 ug | ORAL_TABLET | Freq: Every day | ORAL | Status: DC

## 2011-07-20 MED ORDER — METFORMIN HCL 1000 MG PO TABS: 1000.0000 mg | ORAL_TABLET | Freq: Two times a day (BID) | ORAL | Status: DC

## 2011-07-20 NOTE — Assessment & Plan Note (Signed)
Good control on current regimen. Chronic, stable. Continue meds. BP Readings from Last 3 Encounters:  07/20/11 124/74  04/20/11 132/78  03/13/11 132/68

## 2011-07-20 NOTE — Assessment & Plan Note (Signed)
Lab Results  Component Value Date   HGBA1C 9.5* 03/13/2011  uncontrolled.  recheck today.  Likely some deteriorated as off metformin for last 1+ wk.

## 2011-07-20 NOTE — Assessment & Plan Note (Signed)
Has not been taking synthroid as due. Discussed correct dose. Will recheck TSH next blood draw. Printed out new scripts with correct dose.  See pt instructions.

## 2011-07-20 NOTE — Progress Notes (Signed)
  Subjective:    Patient ID: [name], male    DOB: 27-Oct-1946, 65 y.o.   MRN: [id number]  HPI CC: 3 mo f/u.    [name] presents today for 3 mo f/u.  Still going through a lot, stress, thinks getting some adjusted.  Living at home in [place].  1. DM - some confusion about meds.  Has been taking metformin 500mg  2 in am, 2 in pm, 1 in mid day.  Ran out of metformin early.  Thinks pharmacy didn't give him enough.  Out for last 9 days.  Did not call to refill because had upcoming appt.  States receiving insulin from [facility].  Recently when ran out sugars have been out of control, feeling ill attributed to hyperglycemia.  No lows.  2. HTN - No HA, vision changes, CP/tightness, SOB, leg swelling.  Compliant with meds (metoprolol 25 bid and lisinopril 20 and lasix 80mg  qd.  3. Hypothyroid - again states never received dose of synthroid so has been taking total 300mg  (1 1/2 pills of ) daily.  Supposed to be on daily.  4. Weight -down 3 lbs since last visit! Wt Readings from Last 3 Encounters:  07/20/11 389 lb 12 oz (176.789 kg)  04/20/11 392 lb 0.6 oz (177.828 kg)  03/13/11 400 lb (181.439 kg)   Not taking trazodone - weird dreams.  Not taking zoloft - self tapered off and doing well.  Doesn't think needs med for depression.  Review of Systems Per HPI    Objective:   Physical Exam  Nursing note and vitals reviewed. Constitutional: He appears well-developed and well-nourished. No distress.       obese  HENT:  Head: Normocephalic and atraumatic.  Mouth/Throat: Oropharynx is clear and moist. No oropharyngeal exudate.  Eyes: Conjunctivae and EOM are normal. Pupils are equal, round, and reactive to light. No scleral icterus.  Neck: Normal range of motion. Neck supple.  Cardiovascular: Normal rate, regular rhythm, normal heart sounds and intact distal pulses.   No murmur heard. Pulmonary/Chest: Effort normal and breath sounds normal. No respiratory distress. He has no  wheezes. He has no rales.  Musculoskeletal: He exhibits no edema.  Lymphadenopathy:    He has no cervical adenopathy.  Skin: Skin is warm and dry. No rash noted.  Psychiatric: He has a normal mood and affect.          Assessment & Plan:

## 2011-07-20 NOTE — Patient Instructions (Addendum)
Return at your convenience fasting for blood work, afterwards for physical as you're due. Blood work today. For sugar - take metformin 1000mg  in morning, 500mg  in afternoon, 1000mg  at night.  Continue novolog 70/30. For thyroid - take synthroid + for total of daily.  We will recheck these values when you return for physical. Good to see you today, call us with questions.

## 2011-07-21 LAB — BASIC METABOLIC PANEL
BUN: 13 mg/dL (ref 6–23)
Calcium: 9.1 mg/dL (ref 8.4–10.5)
Creatinine, Ser: 1 mg/dL (ref 0.4–1.5)
GFR: 81.68 mL/min (ref >60.00)
Glucose, Bld: 399 mg/dL — ABNORMAL HIGH (ref 70–99)
Potassium: 4.2 mEq/L (ref 3.5–5.1)

## 2011-07-21 LAB — HEMOGLOBIN A1C: Hgb A1c MFr Bld: 9.1 % — ABNORMAL HIGH (ref 4.6–6.5)

## 2011-08-04 ENCOUNTER — Other Ambulatory Visit: Payer: Self-pay | Admitting: Family Medicine

## 2011-08-07 ENCOUNTER — Other Ambulatory Visit: Payer: Self-pay | Admitting: *Deleted

## 2011-08-07 MED ORDER — ALPRAZOLAM 1 MG PO TABS: 1.0000 mg | ORAL_TABLET | Freq: Every evening | ORAL | Status: DC | PRN

## 2011-08-07 NOTE — Telephone Encounter (Signed)
Last refill 07/09/2011. 

## 2011-08-07 NOTE — Telephone Encounter (Signed)
Ok to refill 

## 2011-08-08 NOTE — Telephone Encounter (Signed)
Rx called in as directed.   

## 2011-08-12 ENCOUNTER — Other Ambulatory Visit: Payer: Self-pay | Admitting: Family Medicine

## 2011-08-30 ENCOUNTER — Other Ambulatory Visit: Payer: Self-pay | Admitting: Family Medicine

## 2011-08-30 ENCOUNTER — Encounter: Payer: Self-pay | Admitting: Family Medicine

## 2011-08-30 DIAGNOSIS — E119 Type 2 diabetes mellitus without complications: Secondary | ICD-10-CM

## 2011-08-30 DIAGNOSIS — Z125 Encounter for screening for malignant neoplasm of prostate: Secondary | ICD-10-CM

## 2011-08-30 DIAGNOSIS — E785 Hyperlipidemia, unspecified: Secondary | ICD-10-CM

## 2011-08-30 DIAGNOSIS — E039 Hypothyroidism, unspecified: Secondary | ICD-10-CM

## 2011-08-30 DIAGNOSIS — Z0000 Encounter for general adult medical examination without abnormal findings: Secondary | ICD-10-CM

## 2011-08-30 DIAGNOSIS — C61 Malignant neoplasm of prostate: Secondary | ICD-10-CM

## 2011-08-31 ENCOUNTER — Other Ambulatory Visit: Payer: Self-pay | Admitting: *Deleted

## 2011-08-31 ENCOUNTER — Ambulatory Visit (INDEPENDENT_AMBULATORY_CARE_PROVIDER_SITE_OTHER): Payer: Medicare Other | Admitting: Family Medicine

## 2011-08-31 ENCOUNTER — Encounter: Payer: Self-pay | Admitting: Family Medicine

## 2011-08-31 ENCOUNTER — Other Ambulatory Visit (INDEPENDENT_AMBULATORY_CARE_PROVIDER_SITE_OTHER): Payer: Medicare Other

## 2011-08-31 VITALS — BP 138/70 | HR 68 | Temp 98.3°F | Wt 397.8 lb

## 2011-08-31 DIAGNOSIS — G894 Chronic pain syndrome: Secondary | ICD-10-CM

## 2011-08-31 DIAGNOSIS — Z125 Encounter for screening for malignant neoplasm of prostate: Secondary | ICD-10-CM

## 2011-08-31 DIAGNOSIS — E119 Type 2 diabetes mellitus without complications: Secondary | ICD-10-CM

## 2011-08-31 DIAGNOSIS — F322 Major depressive disorder, single episode, severe without psychotic features: Secondary | ICD-10-CM

## 2011-08-31 DIAGNOSIS — E785 Hyperlipidemia, unspecified: Secondary | ICD-10-CM

## 2011-08-31 DIAGNOSIS — E039 Hypothyroidism, unspecified: Secondary | ICD-10-CM

## 2011-08-31 DIAGNOSIS — Z0000 Encounter for general adult medical examination without abnormal findings: Secondary | ICD-10-CM

## 2011-08-31 DIAGNOSIS — C61 Malignant neoplasm of prostate: Secondary | ICD-10-CM

## 2011-08-31 LAB — COMPREHENSIVE METABOLIC PANEL
ALT: 36 U/L (ref 0–53)
CO2: 30 mEq/L (ref 19–32)
Calcium: 9.6 mg/dL (ref 8.4–10.5)
Chloride: 100 mEq/L (ref 96–112)
Creatinine, Ser: 1.1 mg/dL (ref 0.4–1.5)
GFR: 69.99 mL/min (ref >60.00)
Glucose, Bld: 210 mg/dL — ABNORMAL HIGH (ref 70–99)
Total Protein: 7 g/dL (ref 6.0–8.3)

## 2011-08-31 LAB — LIPID PANEL: Cholesterol: 158 mg/dL (ref 0–200)

## 2011-08-31 MED ORDER — OXYCODONE-ACETAMINOPHEN 5-325 MG PO TABS: 2.0000 | ORAL_TABLET | Freq: Three times a day (TID) | ORAL | Status: DC | PRN

## 2011-08-31 MED ORDER — MELOXICAM 15 MG PO TABS: ORAL_TABLET | ORAL | Status: DC

## 2011-08-31 MED ORDER — GABAPENTIN 300 MG PO CAPS: 600.0000 mg | ORAL_CAPSULE | Freq: Three times a day (TID) | ORAL | Status: DC
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
## 2011-08-31 NOTE — Telephone Encounter (Signed)
Patient asked at his visit if it was ok to refill this as well.

## 2011-08-31 NOTE — Patient Instructions (Addendum)
Increase gabapentin to 600mg  three times daily.  This will hopefully help any nerve pain in right hand and left ankle. Mobic for pain while you refill percocets. I don't want you taking more than 150 percocets per month. Good to see you, keep appointment next month for physical.

## 2011-09-01 ENCOUNTER — Encounter: Payer: Self-pay | Admitting: Family Medicine

## 2011-09-01 NOTE — Telephone Encounter (Signed)
Have him check at pharmacy if refills available there, if not may refill.  (refilled # 3 last time 10/1)

## 2011-09-01 NOTE — Assessment & Plan Note (Signed)
Check blood work today.

## 2011-09-01 NOTE — Telephone Encounter (Signed)
Patient notified

## 2011-09-01 NOTE — Assessment & Plan Note (Signed)
Off all antidepressants (zoloft, trazodone). If not improving with better pain control, restart SSRI.

## 2011-09-01 NOTE — Assessment & Plan Note (Signed)
Too early to refill percocets.  discussed I do not want him on more than #150/month. Start mobic.  Increase gabapentin to 600mg  tid (was bid). If not helping, consider starting long acting pain med vs referral to pain clinic. Has f/u next week at [facility], will recheck then.

## 2011-09-01 NOTE — Assessment & Plan Note (Signed)
TSH today 

## 2011-09-04 ENCOUNTER — Encounter: Payer: Self-pay | Admitting: Family Medicine

## 2011-09-07 ENCOUNTER — Telehealth: Payer: Self-pay | Admitting: *Deleted

## 2011-09-07 DIAGNOSIS — E119 Type 2 diabetes mellitus without complications: Secondary | ICD-10-CM

## 2011-09-07 NOTE — Telephone Encounter (Signed)
That is fine.  We will just recheck diabetes prior to physical (placed order in chart for after December.

## 2011-09-07 NOTE — Telephone Encounter (Signed)
Patient notified and repeat lab work scheduled.

## 2011-09-07 NOTE — Telephone Encounter (Signed)
He had labs done already for his physical and was wondering about his results. Is there anything he needs to know/change?

## 2011-09-07 NOTE — Telephone Encounter (Signed)
Patient called and said he is getting insurance that will take effect on 11-07-11. He rescheduled his CPX for 11-13-11. He hoped you wouldn't be mad and was wondering about his labs in the meantime. I told him I didn't see you being mad over that and would call him about his labs.

## 2011-09-07 NOTE — Telephone Encounter (Signed)
Mainly sugar was too high.  Everything else was looking ok.  Thyroid good range.

## 2011-09-08 ENCOUNTER — Encounter: Payer: Medicare Other | Admitting: Family Medicine

## 2011-10-06 ENCOUNTER — Other Ambulatory Visit: Payer: Self-pay | Admitting: *Deleted

## 2011-10-06 MED ORDER — OXYCODONE-ACETAMINOPHEN 5-325 MG PO TABS: 2.0000 | ORAL_TABLET | Freq: Three times a day (TID) | ORAL | Status: DC | PRN

## 2011-10-06 MED ORDER — ALPRAZOLAM 1 MG PO TABS: 1.0000 mg | ORAL_TABLET | Freq: Every evening | ORAL | Status: DC | PRN

## 2011-10-06 NOTE — Telephone Encounter (Signed)
May refill.  Phone xanax and printed out percocets.

## 2011-10-06 NOTE — Telephone Encounter (Signed)
Pt apologizes for calling last minute, but says he needs rx's today he is going out of town tomorrow.

## 2011-10-06 NOTE — Telephone Encounter (Signed)
Xanax called in as directed. Patient notified that Rx for percocet was ready for him to pick up at the front desk. Placed up front for pick up.

## 2011-10-16 ENCOUNTER — Other Ambulatory Visit: Payer: Self-pay | Admitting: Family Medicine

## 2011-10-16 NOTE — Telephone Encounter (Signed)
Is it okay to refill medication? 

## 2011-10-16 NOTE — Telephone Encounter (Signed)
Done

## 2011-11-01 ENCOUNTER — Other Ambulatory Visit: Payer: Self-pay | Admitting: Family Medicine

## 2011-11-01 NOTE — Telephone Encounter (Signed)
OK to refill in Dr. G's absence?  

## 2011-11-02 ENCOUNTER — Other Ambulatory Visit: Payer: Self-pay | Admitting: Internal Medicine

## 2011-11-02 MED ORDER — OXYCODONE-ACETAMINOPHEN 5-325 MG PO TABS: 2.0000 | ORAL_TABLET | Freq: Three times a day (TID) | ORAL | Status: DC | PRN

## 2011-11-02 MED ORDER — ALPRAZOLAM 1 MG PO TABS: 1.0000 mg | ORAL_TABLET | Freq: Every evening | ORAL | Status: DC | PRN

## 2011-11-02 NOTE — Telephone Encounter (Signed)
Patient advised.  Rx left at front desk for pick up. 

## 2011-11-02 NOTE — Telephone Encounter (Signed)
Request for refill on Xanax and Percocet.

## 2011-11-02 NOTE — Telephone Encounter (Signed)
Please give to pt.  Thanks.   

## 2011-11-09 ENCOUNTER — Other Ambulatory Visit: Payer: Medicare Other

## 2011-11-13 ENCOUNTER — Ambulatory Visit (INDEPENDENT_AMBULATORY_CARE_PROVIDER_SITE_OTHER): Payer: Medicare Other | Admitting: Family Medicine

## 2011-11-13 ENCOUNTER — Encounter: Payer: Self-pay | Admitting: Family Medicine

## 2011-11-13 VITALS — BP 140/80 | HR 76 | Temp 98.6°F | Wt >= 6400 oz

## 2011-11-13 DIAGNOSIS — E119 Type 2 diabetes mellitus without complications: Secondary | ICD-10-CM

## 2011-11-13 DIAGNOSIS — R21 Rash and other nonspecific skin eruption: Secondary | ICD-10-CM | POA: Insufficient documentation

## 2011-11-13 DIAGNOSIS — Z23 Encounter for immunization: Secondary | ICD-10-CM

## 2011-11-13 DIAGNOSIS — Z0000 Encounter for general adult medical examination without abnormal findings: Secondary | ICD-10-CM

## 2011-11-13 DIAGNOSIS — G894 Chronic pain syndrome: Secondary | ICD-10-CM

## 2011-11-13 DIAGNOSIS — W19XXXA Unspecified fall, initial encounter: Secondary | ICD-10-CM

## 2011-11-13 MED ORDER — LEVOTHYROXINE SODIUM 200 MCG PO TABS: 200.0000 ug | ORAL_TABLET | Freq: Every day | ORAL | Status: DC

## 2011-11-13 MED ORDER — LEVOTHYROXINE SODIUM 25 MCG PO TABS: 25.0000 ug | ORAL_TABLET | Freq: Every day | ORAL | Status: DC

## 2011-11-13 MED ORDER — METOPROLOL TARTRATE 25 MG PO TABS: 25.0000 mg | ORAL_TABLET | Freq: Two times a day (BID) | ORAL | Status: DC

## 2011-11-13 MED ORDER — MELOXICAM 15 MG PO TABS: 15.0000 mg | ORAL_TABLET | Freq: Every day | ORAL | Status: DC | PRN

## 2011-11-13 MED ORDER — LISINOPRIL 20 MG PO TABS: 20.0000 mg | ORAL_TABLET | Freq: Every day | ORAL | Status: DC

## 2011-11-13 NOTE — Assessment & Plan Note (Signed)
Location and description suspicious for intertrigo, mild case. Has been using HC, rec start lotrimin OTC.   Update me if not improved.

## 2011-11-13 NOTE — Assessment & Plan Note (Signed)
Discussed lost meds, will not refill controlled substances early.  Pt understands. Refilled all other lost meds.

## 2011-11-13 NOTE — Assessment & Plan Note (Signed)
Recent fall, tripped over car lift at friend's garage. Hand seems stable, anticipate contusion, healing. Left foot - concern for toe fractures, possible MT fracture. Advised I wanted xray, as depending on results may change management, may be indicated for surgery. Pt declines, states would not want surgery, feels healing well, prefers to monitor on his own.

## 2011-11-13 NOTE — Assessment & Plan Note (Signed)
I have personally reviewed the Medicare Annual Wellness questionnaire and have noted 1. The patient's medical and social history 2. Their use of alcohol, tobacco or illicit drugs 3. Their current medications and supplements 4. The patient's functional ability including ADL's, fall risks, home safety risks and hearing or visual impairment. 5. Diet and physical activities 6. Evidence for depression or mood disorders The patients weight, height, BMI have been recorded in the chart.  Hearing and vision has been addressed. I have made referrals, counseling and provided education to the patient based review of the above and I have provided the pt with a written personalized care plan for preventive services.  Pneumovax and flu shot today. Will look into shingles. Due for colonoscopy, rec schedule appt. PSA 0.01 08/2011, s/p radical prostatectomy for cancer. Td due 2014.
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
## 2011-11-13 NOTE — Patient Instructions (Addendum)
Call your insurace about the shingles shot to see if it is covered or how much it would cost and where is cheaper (here or pharmacy).  If you want to receive here, call for nurse visit. Try lotrimin cream for rash - may be beginnings of intertrigo. Pneumonia and flu shot today. Call Dr. Mariah Milling to schedule appointment as you're due. Call Dr. Al Corpus to schedule foot appointment. Call Dr. Bluford Kaufmann to schedule colonoscopy. Return in 1 month for follow up.  Let me know if foot not better for xray.

## 2011-11-13 NOTE — Assessment & Plan Note (Signed)
rtc 1 mo for f/u, blood work then.

## 2011-11-30 ENCOUNTER — Other Ambulatory Visit: Payer: Self-pay | Admitting: *Deleted

## 2011-11-30 MED ORDER — ALPRAZOLAM 1 MG PO TABS: 1.0000 mg | ORAL_TABLET | Freq: Every evening | ORAL | Status: DC | PRN

## 2011-11-30 NOTE — Telephone Encounter (Signed)
plz phone in. 

## 2011-11-30 NOTE — Telephone Encounter (Signed)
Rx called in as directed.   

## 2011-11-30 NOTE — Telephone Encounter (Signed)
Ok to refill 

## 2011-12-05 ENCOUNTER — Other Ambulatory Visit: Payer: Self-pay | Admitting: Family Medicine

## 2011-12-05 NOTE — Telephone Encounter (Signed)
Patient requests Percocet refills for 2-3 month supply; Insulin, [place] refills. Call back number [phone]

## 2011-12-05 NOTE — Telephone Encounter (Signed)
Spoke with patient. He said he has new insurance that will cover insulin so he is hoping to be able to start taking it the way he's supposed to. He said it will cover lantus, so he wants to start that again if you think he should. He also needs the humulin 70/30 and the humulin R which is sliding scale, so he requests extra vials. He also requests percocet scripts for 3 month supply as you have done for him in the past. I told him I would call when they were ready to be picked up.

## 2011-12-06 MED ORDER — OXYCODONE-ACETAMINOPHEN 5-325 MG PO TABS: 2.0000 | ORAL_TABLET | Freq: Three times a day (TID) | ORAL | Status: DC | PRN

## 2011-12-06 MED ORDER — INSULIN REGULAR HUMAN 100 UNIT/ML IJ SOLN: INTRAMUSCULAR | Status: DC

## 2011-12-06 MED ORDER — INSULIN NPH ISOPHANE & REGULAR (70-30) 100 UNIT/ML ~~LOC~~ SUSP: 50.0000 [IU] | Freq: Two times a day (BID) | SUBCUTANEOUS | Status: DC

## 2011-12-06 NOTE — Telephone Encounter (Signed)
I would continue novolog 70/30 for now as well as sliding scale.  What sliding scale is pt using for humulin R?  We don't have it in our med list. Will refill percocets x 1 month at a time given quantity each fill.

## 2011-12-07 NOTE — Telephone Encounter (Signed)
Noted, will discuss at f/u visit

## 2011-12-07 NOTE — Telephone Encounter (Signed)
Patient notified. He said he takes 20 units of the R TID if needed depending on his sugar. I got the impression that he may be a little confused on exactly what sliding scale means. I advised that I would call him and let him know a more definitive plan if you thought he needed to change the dosing. Rx placed up front for pick up.

## 2011-12-14 ENCOUNTER — Ambulatory Visit: Payer: No Typology Code available for payment source | Admitting: Family Medicine

## 2012-01-03 ENCOUNTER — Other Ambulatory Visit: Payer: Self-pay | Admitting: *Deleted

## 2012-01-03 MED ORDER — OXYCODONE-ACETAMINOPHEN 5-325 MG PO TABS: 2.0000 | ORAL_TABLET | Freq: Three times a day (TID) | ORAL | Status: DC | PRN

## 2012-01-03 MED ORDER — ALPRAZOLAM 1 MG PO TABS: 1.0000 mg | ORAL_TABLET | Freq: Every evening | ORAL | Status: DC | PRN

## 2012-01-03 NOTE — Telephone Encounter (Signed)
Rx for Alprazolam called to [facility], Rx for pain medication left at front desk.  Patient advised via message left on cell phone voicemail.

## 2012-01-03 NOTE — Telephone Encounter (Signed)
plz phone in and notify ready to pick up

## 2012-01-04 ENCOUNTER — Other Ambulatory Visit: Payer: Self-pay | Admitting: Family Medicine

## 2012-01-05 HISTORY — PX: OTHER SURGICAL HISTORY: SHX169

## 2012-01-10 DIAGNOSIS — R0602 Shortness of breath: Secondary | ICD-10-CM

## 2012-01-10 LAB — CBC
HCT: 40.1 % (ref 40.0–52.0)
MCHC: 30.5 g/dL — ABNORMAL LOW (ref 32.0–36.0)
MCV: 80 fL (ref 80–100)
Platelet: 215 10*3/uL (ref 150–440)
RBC: 5.01 10*6/uL (ref 4.40–5.90)
RDW: 14.8 % — ABNORMAL HIGH (ref 11.5–14.5)
WBC: 6.3 10*3/uL (ref 3.8–10.6)

## 2012-01-10 LAB — COMPREHENSIVE METABOLIC PANEL
Anion Gap: 10 (ref 7–16)
Bilirubin,Total: 0.7 mg/dL (ref 0.2–1.0)
Calcium, Total: 9.4 mg/dL (ref 8.5–10.1)
Chloride: 102 mmol/L (ref 98–107)
Co2: 27 mmol/L (ref 21–32)
Creatinine: 1.11 mg/dL (ref 0.60–1.30)
EGFR (African American): >60
EGFR (Non-African Amer.): >60
Osmolality: 286 (ref 275–301)
Potassium: 4.5 mmol/L (ref 3.5–5.1)
SGOT(AST): 42 U/L — ABNORMAL HIGH (ref 15–37)
SGPT (ALT): 38 U/L
Sodium: 139 mmol/L (ref 136–145)
Total Protein: 7.6 g/dL (ref 6.4–8.2)

## 2012-01-10 LAB — CK TOTAL AND CKMB (NOT AT ARMC)
CK, Total: 154 U/L (ref 35–232)
CK, Total: 120 U/L (ref 35–232)
CK-MB: 2.7 ng/mL (ref 0.5–3.6)

## 2012-01-10 LAB — TROPONIN I: Troponin-I: 0.02 ng/mL

## 2012-01-10 LAB — PROTIME-INR: INR: 1.1

## 2012-01-10 LAB — TSH: Thyroid Stimulating Horm: 1.56 u[IU]/mL

## 2012-01-11 DIAGNOSIS — R079 Chest pain, unspecified: Secondary | ICD-10-CM

## 2012-01-11 LAB — LIPID PANEL
Cholesterol: 168 mg/dL (ref 0–200)
Direct LDL: 116
HDL: 29 mg/dL — AB (ref 35–70)
Triglycerides: 117
VLDL Cholesterol, Calc: 23 mg/dL (ref 5–40)

## 2012-01-11 LAB — CBC WITH DIFFERENTIAL/PLATELET
Basophil #: 0 10*3/uL (ref 0.0–0.1)
Lymphocyte #: 2 10*3/uL (ref 1.0–3.6)
MCHC: 32.3 g/dL (ref 32.0–36.0)
MCV: 80 fL (ref 80–100)
Monocyte %: 12 %
Neutrophil %: 55.7 %
Platelet: 204 10*3/uL (ref 150–440)
RBC: 4.43 10*6/uL (ref 4.40–5.90)
RDW: 16.5 % — ABNORMAL HIGH (ref 11.5–14.5)
WBC: 7 10*3/uL (ref 3.8–10.6)

## 2012-01-11 LAB — BASIC METABOLIC PANEL
Anion Gap: 13 (ref 7–16)
BUN: 18 mg/dL (ref 7–18)
Co2: 29 mmol/L (ref 21–32)
Creatinine: 1.09 mg/dL (ref 0.60–1.30)
EGFR (African American): >60
Osmolality: 290 (ref 275–301)
Sodium: 143 mmol/L (ref 136–145)

## 2012-01-11 LAB — CK TOTAL AND CKMB (NOT AT ARMC): CK-MB: 2.6 ng/mL (ref 0.5–3.6)

## 2012-01-11 LAB — MAGNESIUM: Magnesium: 1.8 mg/dL

## 2012-01-11 LAB — HEMOGLOBIN A1C
A1c: 10.1
Hemoglobin A1C: 10.1 % — ABNORMAL HIGH (ref 4.2–6.3)

## 2012-01-11 LAB — TROPONIN I: Troponin-I: 0.02 ng/mL

## 2012-01-12 ENCOUNTER — Inpatient Hospital Stay: Payer: Self-pay | Admitting: Internal Medicine

## 2012-01-12 LAB — PRO B NATRIURETIC PEPTIDE: B-Type Natriuretic Peptide: 151 pg/mL — ABNORMAL HIGH (ref 0–125)

## 2012-01-12 LAB — BRAIN NATRIURETIC PEPTIDE: BNP: 151 pg/mL

## 2012-01-13 LAB — URINALYSIS, COMPLETE
Bacteria: NONE SEEN
Bilirubin,UR: NEGATIVE
Glucose,UR: NEGATIVE mg/dL (ref 0–75)
Leukocyte Esterase: NEGATIVE
Nitrite: NEGATIVE
Protein: NEGATIVE
RBC,UR: NONE SEEN /HPF (ref 0–5)
Specific Gravity: 1.01 (ref 1.003–1.030)
Squamous Epithelial: 1
WBC UR: NONE SEEN /HPF (ref 0–5)

## 2012-01-13 LAB — BASIC METABOLIC PANEL
Anion Gap: 9 (ref 7–16)
BUN: 25 mg/dL — AB (ref 4–21)
BUN: 25 mg/dL — ABNORMAL HIGH (ref 7–18)
Creat: 1.29
Creatinine: 1.29 mg/dL (ref 0.60–1.30)
EGFR (African American): >60
EGFR (Non-African Amer.): 59 — ABNORMAL LOW
Potassium: 4.2 mmol/L
Sodium: 140 mmol/L (ref 136–145)
Sodium: 140 mmol/L (ref 137–147)

## 2012-01-13 LAB — CBC: WBC: 6.8

## 2012-01-15 LAB — CBC WITH DIFFERENTIAL/PLATELET
Basophil %: 0.6 %
Eosinophil #: 0.2 10*3/uL (ref 0.0–0.7)
HCT: 36 % — ABNORMAL LOW (ref 40.0–52.0)
HGB: 11.5 g/dL — ABNORMAL LOW (ref 13.0–18.0)
Lymphocyte %: 34.6 %
Monocyte %: 11.7 %
Neutrophil #: 3.4 10*3/uL (ref 1.4–6.5)
Neutrophil %: 49.9 %
RDW: 16.5 % — ABNORMAL HIGH (ref 11.5–14.5)
WBC: 6.8 10*3/uL (ref 3.8–10.6)

## 2012-01-16 LAB — BASIC METABOLIC PANEL
Anion Gap: 8 (ref 7–16)
BUN: 22 mg/dL — ABNORMAL HIGH (ref 7–18)
Calcium, Total: 9 mg/dL (ref 8.5–10.1)
EGFR (African American): >60
EGFR (Non-African Amer.): >60
Glucose: 249 mg/dL — ABNORMAL HIGH (ref 65–99)
Osmolality: 284 (ref 275–301)
Sodium: 136 mmol/L (ref 136–145)

## 2012-01-17 LAB — OCCULT BLOOD X 1 CARD TO LAB, STOOL: Occult Blood, Feces: NEGATIVE

## 2012-01-23 ENCOUNTER — Encounter: Payer: Self-pay | Admitting: Family Medicine

## 2012-01-23 ENCOUNTER — Ambulatory Visit (INDEPENDENT_AMBULATORY_CARE_PROVIDER_SITE_OTHER): Payer: Medicare Other | Admitting: Family Medicine

## 2012-01-23 VITALS — BP 92/74 | HR 90 | Temp 98.5°F | Wt 396.8 lb

## 2012-01-23 DIAGNOSIS — R0989 Other specified symptoms and signs involving the circulatory and respiratory systems: Secondary | ICD-10-CM

## 2012-01-23 DIAGNOSIS — E119 Type 2 diabetes mellitus without complications: Secondary | ICD-10-CM

## 2012-01-23 DIAGNOSIS — R079 Chest pain, unspecified: Secondary | ICD-10-CM | POA: Insufficient documentation

## 2012-01-23 DIAGNOSIS — R0600 Dyspnea, unspecified: Secondary | ICD-10-CM

## 2012-01-23 DIAGNOSIS — E039 Hypothyroidism, unspecified: Secondary | ICD-10-CM

## 2012-01-23 DIAGNOSIS — I5020 Unspecified systolic (congestive) heart failure: Secondary | ICD-10-CM | POA: Insufficient documentation

## 2012-01-23 MED ORDER — LEVOTHYROXINE SODIUM 112 MCG PO TABS: 224.0000 ug | ORAL_TABLET | Freq: Every day | ORAL | Status: AC

## 2012-01-23 NOTE — Patient Instructions (Addendum)
Stop lasix.  Continue demadex 20mg  twice a day. Stop synthroid 200 and 25 mcg, start synthroid 2 pills daily (one less copay). Pass by Marion's office to schedule sleep study Call [phone] to schedule weight loss seminar to learn about bariatric surgery options. Good to see you today, call us with questions. Keep track of blood pressure at home - to ensure not staying low like now. Return in 1 month for follow up diabetes and breathing, keep log of sugars and weight and blood pressure if able 1 week prior to appointment. Return a few days prior fasting for blood work.

## 2012-01-23 NOTE — Assessment & Plan Note (Addendum)
Recent admission for this.  Ruled out ACS or acute cardiac cause.  Has f/u planned with Dr. Mariah Milling. Continue current regimen. Hypotensive today but denies sxs.  Has been taking lasix and demadex together - advised stop lasix, check Cr today to r/o ARF from double loop diuretics. possible contribution is untreated OSA - will refer to pulm for rpt sleep study (pt states has had dx OSA in past but did not wear mask in past.)
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
## 2012-01-23 NOTE — Assessment & Plan Note (Signed)
Return next month for DM check. A1c in hospital 10.1% Regular insulin changed to humulin in hospital

## 2012-01-23 NOTE — Assessment & Plan Note (Signed)
rec pt start by attending free weight loss seminar at [facility]. To call and make appt for this.

## 2012-01-23 NOTE — Assessment & Plan Note (Signed)
Mild, found at hospitalization. Changed to demadex, continue for now.

## 2012-01-23 NOTE — Assessment & Plan Note (Signed)
Change synthroid to 2 pills daily.  Recheck TSH in 1 mo.

## 2012-01-24 ENCOUNTER — Emergency Department: Payer: Self-pay | Admitting: Unknown Physician Specialty

## 2012-01-24 ENCOUNTER — Telehealth: Payer: Self-pay | Admitting: Family Medicine

## 2012-01-24 LAB — CBC
HGB: 13 g/dL (ref 13.0–18.0)
MCH: 26 pg (ref 26.0–34.0)
Platelet: 224 10*3/uL (ref 150–440)
RBC: 4.99 10*6/uL (ref 4.40–5.90)
RDW: 16.4 % — ABNORMAL HIGH (ref 11.5–14.5)

## 2012-01-24 LAB — COMPREHENSIVE METABOLIC PANEL
Alkaline Phosphatase: 76 U/L (ref 50–136)
BUN: 23 mg/dL — ABNORMAL HIGH (ref 7–18)
Bilirubin,Total: 0.4 mg/dL (ref 0.2–1.0)
Creatinine: 1.18 mg/dL (ref 0.60–1.30)
EGFR (African American): >60
EGFR (Non-African Amer.): >60
Glucose: 155 mg/dL — ABNORMAL HIGH (ref 65–99)
Osmolality: 290 (ref 275–301)
Potassium: 4.5 mmol/L (ref 3.5–5.1)
SGOT(AST): 45 U/L — ABNORMAL HIGH (ref 15–37)
Sodium: 142 mmol/L (ref 136–145)
Total Protein: 7.6 g/dL (ref 6.4–8.2)

## 2012-01-24 LAB — BASIC METABOLIC PANEL
BUN: 28 mg/dL — ABNORMAL HIGH (ref 6–23)
CO2: 27 mEq/L (ref 19–32)
Calcium: 9.8 mg/dL (ref 8.4–10.5)
GFR: 53.16 mL/min — ABNORMAL LOW (ref >60.00)
Glucose, Bld: 208 mg/dL — ABNORMAL HIGH (ref 70–99)
Sodium: 137 mEq/L (ref 135–145)

## 2012-01-24 LAB — PRO B NATRIURETIC PEPTIDE: B-Type Natriuretic Peptide: 132 pg/mL — ABNORMAL HIGH (ref 0–125)

## 2012-01-24 LAB — TROPONIN I: Troponin-I: <0.02 ng/mL

## 2012-01-24 NOTE — Telephone Encounter (Signed)
If he has sig weight gain with CP, then I would advise ER eval.  Dr. Reece Agar stopped the lasix yesterday and that was reasonable.  But if patient is that tenuous with fluid status, then he'll need labs done and eval tonight.  I don't think we'd have labs back in time to affect mgmt tonight.  I tried to call [facility] RN but no answer.  Please call again.

## 2012-01-24 NOTE — Telephone Encounter (Signed)
LMOVM of [name], [facility] with urgent delivery, asking her to call back signifying that she has received and understands the message.  Will try again periodically between now and 5 PM.

## 2012-01-24 NOTE — Telephone Encounter (Signed)
[facility] called, says pt is gain weight and has some pain in his chest. Pt was seen in the office on yesterday by Dr. Sharen Hones... Maud Deed,. [facility] # [phone]

## 2012-01-24 NOTE — Telephone Encounter (Signed)
Please call tomorrow and see that the message was delivered and then sent to Dr. Reece Agar as a FYI.  Thanks.

## 2012-01-24 NOTE — Telephone Encounter (Signed)
[name] returned the call and stated that she had received and understood the instructions.  She says she had tried to phone the patient but got no answer and left a detailed message on his VM.  She also states that she is going to continue to try to call him to deliver the message directly.

## 2012-01-25 NOTE — Telephone Encounter (Signed)
Spoke to [place] and was advised that she left the detailed message on patient's voicemail and she is sure that he got the message. Velna Hatchet stated that she had already advised patient that he needed to go to the ER and he refused. Message sent to Dr. Sharen Hones per Dr. Para March.

## 2012-01-25 NOTE — Telephone Encounter (Signed)
Noted. Pt seen at ER yesterday.  Awaiting records.

## 2012-01-25 NOTE — Telephone Encounter (Signed)
Left message on voicemail for [name] to call back.

## 2012-01-26 ENCOUNTER — Telehealth: Payer: Self-pay

## 2012-01-26 NOTE — Telephone Encounter (Signed)
Melinda with [facility] calling with an update on [name]. [name] was seen by Dr. Reece Agar on January 23 2012 with new medication instructions, but the patient did go to the ER on 01/24/2012 and was given a NTG patch to wear daily for chest pain. He has some shortness of breath mostly at night with mild swelling in legs, he is feeling much better today and is up about. He was changed to Torsemide 20 mg bid with Lasix stopped on 01/23/2012 by Dr. Reece Agar. Today blood pressure is 112/78 HR 60. The is an update on patient progress. Please contact Juliette Alcide [phone] with [facility] for any further instructions.

## 2012-01-28 ENCOUNTER — Telehealth: Payer: Self-pay | Admitting: Family Medicine

## 2012-01-28 MED ORDER — METOPROLOL TARTRATE 25 MG PO TABS: 12.5000 mg | ORAL_TABLET | Freq: Two times a day (BID) | ORAL | Status: AC

## 2012-01-28 NOTE — Telephone Encounter (Signed)
Reviewed records from ER - BNP 132.  Cr 1.18 (01/24/2012), improved from visit here.  TnI <0.02.  CXR WNL.  EKG overall unchanged - 1st degree AV block - would like him to back down on metoprolol to 1/2 pill bid (12.5mg  daily).  May stop completely   Lab Results  Component Value Date   CREATININE 1.4 01/23/2012

## 2012-01-28 NOTE — Telephone Encounter (Signed)
Prior note closed early...  Reviewed records from ER - BNP 132. Cr 1.18 (01/24/2012), improved from visit here. TnI <0.02. CXR WNL. EKG overall unchanged - 1st degree AV block - would like him to back down on metoprolol to 1/2 pill bid (12.5mg  daily). May stop completely but will await eval with Dr. Mariah Milling.  Changed dose in chart. Lab Results   Component  Value  Date    CREATININE  1.4  01/23/2012

## 2012-01-29 ENCOUNTER — Encounter: Payer: Self-pay | Admitting: Family Medicine

## 2012-01-29 MED ORDER — TORSEMIDE 20 MG PO TABS: ORAL_TABLET | ORAL | Status: DC

## 2012-01-29 NOTE — Telephone Encounter (Signed)
No I want him to stop lasix.  Doesn't need 2 separate loop diuretics.  If gaining weight only on demadex, may increase demadex to 40mg  in am and 20 mg in afternoon.  Changed dose in chart.

## 2012-01-29 NOTE — Telephone Encounter (Signed)
Patient notified. He will not take lasix and he will increase demadex as directed.

## 2012-01-29 NOTE — Telephone Encounter (Signed)
Spoke with patient and notified him to decrease metoprolol to 1/2 pill twice daily (12.5 mg). He verbalized understanding.   He said when he went to ER the doctor there told him to take 1 lasix along with the demadex to help avoid the fluid issue. He said he has been doing that and his weight has come down, he hasn't been short of breath and he is urinating fine. I advised that I needed to check with you about that since you told him to stop it at his last visit. He is aware that you are out of the office and that it may be 2 days before he hears back from me. He said he goes to see Dr. Mariah Milling in 2 days and said he will ask him about it if he hasn't heard back from me. Please advise if he should stay off the lasix or restart it.

## 2012-01-30 ENCOUNTER — Telehealth: Payer: Self-pay

## 2012-01-30 ENCOUNTER — Encounter: Payer: Self-pay | Admitting: *Deleted

## 2012-01-30 NOTE — Telephone Encounter (Signed)
Notified Velna Hatchet with [facility] for an update on how Mr. Berrios is doing. Velna Hatchet states Mr. Lamorte weight over the weekend 393.5 lb. Today his weight is 389.1 lb, BP 122/70 - HR 80, SpO2 94% and R- 20. The patient states, "he feels better today than he has in a long time" per Velna Hatchet (nurse) with [facility]. The patient has shortness of breath only with over exertion, no chest pain and no shortness of breath at rest. He has been drinking about the same fluids daily. Please contact Velna Hatchet (nurse)  @ [phone] if you have further questions or need to make any medication changes.

## 2012-01-30 NOTE — Telephone Encounter (Signed)
error 

## 2012-01-30 NOTE — Telephone Encounter (Signed)
Can we call to see how he is doing BP ok? Edema improving?

## 2012-01-30 NOTE — Telephone Encounter (Signed)
This is the most recent update on [name].

## 2012-01-31 ENCOUNTER — Encounter: Payer: Self-pay | Admitting: Nurse Practitioner

## 2012-01-31 ENCOUNTER — Ambulatory Visit (INDEPENDENT_AMBULATORY_CARE_PROVIDER_SITE_OTHER): Payer: Medicare Other | Admitting: Nurse Practitioner

## 2012-01-31 VITALS — BP 124/80 | HR 75 | Ht 71.0 in | Wt 389.0 lb

## 2012-01-31 DIAGNOSIS — I5020 Unspecified systolic (congestive) heart failure: Secondary | ICD-10-CM

## 2012-01-31 DIAGNOSIS — I878 Other specified disorders of veins: Secondary | ICD-10-CM | POA: Insufficient documentation

## 2012-01-31 DIAGNOSIS — I5040 Unspecified combined systolic (congestive) and diastolic (congestive) heart failure: Secondary | ICD-10-CM | POA: Insufficient documentation

## 2012-01-31 DIAGNOSIS — R079 Chest pain, unspecified: Secondary | ICD-10-CM

## 2012-01-31 DIAGNOSIS — E119 Type 2 diabetes mellitus without complications: Secondary | ICD-10-CM

## 2012-01-31 DIAGNOSIS — I252 Old myocardial infarction: Secondary | ICD-10-CM

## 2012-01-31 DIAGNOSIS — G4730 Sleep apnea, unspecified: Secondary | ICD-10-CM | POA: Insufficient documentation

## 2012-01-31 DIAGNOSIS — I209 Angina pectoris, unspecified: Secondary | ICD-10-CM

## 2012-01-31 DIAGNOSIS — I509 Heart failure, unspecified: Secondary | ICD-10-CM

## 2012-01-31 DIAGNOSIS — I5032 Chronic diastolic (congestive) heart failure: Secondary | ICD-10-CM

## 2012-01-31 NOTE — Telephone Encounter (Signed)
Spoke with Velna Hatchet and notified her of changes.

## 2012-01-31 NOTE — Patient Instructions (Addendum)
BMET today. Follow up with Dr. Mariah Milling in 6 weeks.
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
## 2012-01-31 NOTE — Telephone Encounter (Signed)
I increased demadex to 40 in am and 20 in early afternoon - can we call Velna Hatchet to ensure she is aware of change as well?  thansk.

## 2012-02-01 ENCOUNTER — Telehealth: Payer: Self-pay | Admitting: Family Medicine

## 2012-02-01 ENCOUNTER — Telehealth: Payer: Self-pay | Admitting: *Deleted

## 2012-02-01 LAB — BASIC METABOLIC PANEL
BUN/Creatinine Ratio: 23 — ABNORMAL HIGH (ref 10–22)
BUN: 24 mg/dL (ref 8–27)
Creatinine, Ser: 1.05 mg/dL (ref 0.76–1.27)
GFR calc Af Amer: 86 mL/min/{1.73_m2} (ref >59)
GFR calc non Af Amer: 74 mL/min/{1.73_m2} (ref >59)

## 2012-02-01 MED ORDER — OXYCODONE-ACETAMINOPHEN 5-325 MG PO TABS: 2.0000 | ORAL_TABLET | Freq: Three times a day (TID) | ORAL | Status: DC | PRN

## 2012-02-01 NOTE — Telephone Encounter (Signed)
pt notified of lab results and to cut down on carbs, will send results to PCP per pt's request. Danielle Rankin

## 2012-02-01 NOTE — Telephone Encounter (Signed)
Message copied by Tarri Fuller on Thu Feb 01, 2012 10:10 AM ------      Message from: Nicolasa Ducking R      Created: Thu Feb 01, 2012  9:00 AM       Potassium and kidney function look good.  Blood glucose is high - continue to take prescribed diabetic meds and limit carbohydrates in diet.  Increase activity as planned.

## 2012-02-01 NOTE — Telephone Encounter (Signed)
plz notify ready to pick up. 

## 2012-02-01 NOTE — Telephone Encounter (Signed)
Patient is asking for a written prescription for Percocet.  He has enough until Sunday. Please call patient when prescription is ready.

## 2012-02-01 NOTE — Telephone Encounter (Signed)
Patient notified and Rx placed up front for pick up. 

## 2012-02-05 ENCOUNTER — Other Ambulatory Visit: Payer: Self-pay | Admitting: *Deleted

## 2012-02-05 MED ORDER — ALPRAZOLAM 1 MG PO TABS: 1.0000 mg | ORAL_TABLET | Freq: Every evening | ORAL | Status: DC | PRN

## 2012-02-05 NOTE — Telephone Encounter (Signed)
Ok to refill 

## 2012-02-05 NOTE — Telephone Encounter (Signed)
Received faxed refill request from pharmacy. Is it okay to refill medication? 

## 2012-02-06 NOTE — Telephone Encounter (Signed)
Rx called in as directed.   

## 2012-02-07 DIAGNOSIS — I509 Heart failure, unspecified: Secondary | ICD-10-CM

## 2012-02-07 DIAGNOSIS — J449 Chronic obstructive pulmonary disease, unspecified: Secondary | ICD-10-CM

## 2012-02-07 DIAGNOSIS — I5023 Acute on chronic systolic (congestive) heart failure: Secondary | ICD-10-CM

## 2012-02-07 DIAGNOSIS — E119 Type 2 diabetes mellitus without complications: Secondary | ICD-10-CM

## 2012-02-13 ENCOUNTER — Institutional Professional Consult (permissible substitution): Payer: Medicare Other | Admitting: Pulmonary Disease

## 2012-02-19 ENCOUNTER — Other Ambulatory Visit (INDEPENDENT_AMBULATORY_CARE_PROVIDER_SITE_OTHER): Payer: Medicare Other

## 2012-02-19 DIAGNOSIS — E119 Type 2 diabetes mellitus without complications: Secondary | ICD-10-CM

## 2012-02-19 LAB — MICROALBUMIN / CREATININE URINE RATIO
Creatinine,U: 119.6 mg/dL
Microalb Creat Ratio: 16.5 mg/g (ref 0.0–30.0)

## 2012-02-19 LAB — HEMOGLOBIN A1C: Hgb A1c MFr Bld: 9 % — ABNORMAL HIGH (ref 4.6–6.5)

## 2012-02-23 ENCOUNTER — Encounter: Payer: Self-pay | Admitting: Family Medicine

## 2012-02-23 ENCOUNTER — Ambulatory Visit (INDEPENDENT_AMBULATORY_CARE_PROVIDER_SITE_OTHER): Payer: Medicare Other | Admitting: Family Medicine

## 2012-02-23 VITALS — BP 110/60 | HR 88 | Temp 97.9°F | Wt 383.5 lb

## 2012-02-23 DIAGNOSIS — E1139 Type 2 diabetes mellitus with other diabetic ophthalmic complication: Secondary | ICD-10-CM

## 2012-02-23 DIAGNOSIS — I5032 Chronic diastolic (congestive) heart failure: Secondary | ICD-10-CM

## 2012-02-23 DIAGNOSIS — I10 Essential (primary) hypertension: Secondary | ICD-10-CM

## 2012-02-23 DIAGNOSIS — R7309 Other abnormal glucose: Secondary | ICD-10-CM

## 2012-02-23 DIAGNOSIS — I509 Heart failure, unspecified: Secondary | ICD-10-CM

## 2012-02-23 DIAGNOSIS — H579 Unspecified disorder of eye and adnexa: Secondary | ICD-10-CM

## 2012-02-23 DIAGNOSIS — R739 Hyperglycemia, unspecified: Secondary | ICD-10-CM

## 2012-02-23 DIAGNOSIS — E039 Hypothyroidism, unspecified: Secondary | ICD-10-CM

## 2012-02-23 MED ORDER — INSULIN REGULAR HUMAN 100 UNIT/ML IJ SOLN: INTRAMUSCULAR | Status: DC

## 2012-02-23 MED ORDER — OXYCODONE-ACETAMINOPHEN 5-325 MG PO TABS: 2.0000 | ORAL_TABLET | Freq: Three times a day (TID) | ORAL | Status: DC | PRN

## 2012-02-23 MED ORDER — ALPRAZOLAM 1 MG PO TABS: 1.0000 mg | ORAL_TABLET | Freq: Every evening | ORAL | Status: DC | PRN

## 2012-02-23 NOTE — Assessment & Plan Note (Signed)
Stable on current dose. Continue. 

## 2012-02-23 NOTE — Assessment & Plan Note (Signed)
BP Readings from Last 3 Encounters:  02/23/12 110/60  01/31/12 124/80  01/23/12 92/74  chronic, stable. decrease torsemide to 40mg  daily given low readings at home.

## 2012-02-23 NOTE — Assessment & Plan Note (Signed)
Foot exam today. rec schedule vision exam when due. Again reviewed difference between basal (which is 70/30 in his case) and mealtime or SSI correction (humulinR). Reviewed importance of daily regulat 70/30. No lows, start regular 70/30 use and then reassess.  A1c is decreasing some.  Goal for him <8%. Refilled humulin per request (cheaper than novolinR).

## 2012-02-23 NOTE — Patient Instructions (Addendum)
Take 50 units of 70/30 every day with breakfast and dinner (same daily dose). Take R (regular insulin humulin) with meals depending on how your sugars are doing (around 25 units). Good to see you today, return in 3 months for follow up. Call us with quesitons Decrease torsemide to 40mg  daily.  Watch weight, leg swelling, and shortness of breath and update me with concerns.

## 2012-02-23 NOTE — Progress Notes (Signed)
  Subjective:    Patient ID: [name], male    DOB: [birth year], 66 y.o.   MRN: [id number]  HPI CC: DM check  66 yo with h/o T2DM uncontrolled, morbid obesity, HTN, HLD, GERD, chronic pain, and CAD s/p stent 1999 and last catheterization 2007 with residual disease and h/o prostate cancer s/p radical prostatectomy presents for diabetes check.  DM - Confused with insulin use.  No low sugars.  Lowest fasting has been 130.  Overall high.  Checks sugars 3-4 times daily.  Last vision screen 04/2011.  Lab Results  Component Value Date   HGBA1C 9.0* 02/19/2012    24 hr recall: yesterday morning ate boiled egg and 5 strips of bacon for breakfast Then throughout day had 5 apples and several baby carrots, 1 bottle flavored water, 1 bottle glucerna.   woke up this morning and sugar was 405. cbg after insulin was 261.  CHF, HTN - blood pressures doing great at home.  Weight continues decreasing.  Actually has had some low sbp's - 80-90s  BP Readings from Last 3 Encounters:  02/23/12 110/60  01/31/12 124/80  01/23/12 92/74   Wt Readings from Last 3 Encounters:  02/23/12 383 lb 8 oz (173.954 kg)  01/31/12 389 lb (176.449 kg)  01/23/12 396 lb 12 oz (179.965 kg)     Review of Systems Per HPI    Objective:   Physical Exam  Nursing note and vitals reviewed. Constitutional: He appears well-developed and well-nourished. No distress.       obese  Eyes: Conjunctivae and EOM are normal. Pupils are equal, round, and reactive to light. No scleral icterus.  Neck: Normal range of motion. Neck supple.  Cardiovascular: Normal rate, regular rhythm and intact distal pulses.   Murmur (3/6 SEM best at LUSB) heard. Pulmonary/Chest: Effort normal and breath sounds normal. No respiratory distress. He has no wheezes. He has no rales.  Musculoskeletal: He exhibits no edema.       Diabetic foot exam: Hemosiderin deposits bilaterally, bilateral feet scaly No skin breakdown + calluses  Diminished DP/PT  pulses Normal sensation to light touch and decreased to monofilament bilaterally Nails normal  Lymphadenopathy:    He has no cervical adenopathy.  Skin: Skin is warm and dry. No rash noted.  Psychiatric: He has a normal mood and affect.       Assessment & Plan:

## 2012-02-23 NOTE — Assessment & Plan Note (Signed)
Given continued lows, recommend back off torsemide to only 40mg  daily (stop noontime 20mg  dose). If better control of hypotension not achieved, would back off metoprolol (currently on 1/2 pill bid).

## 2012-03-06 ENCOUNTER — Telehealth: Payer: Self-pay | Admitting: Family Medicine

## 2012-03-06 NOTE — Telephone Encounter (Signed)
Noted. Agree.   Can we call and see how weights are doing and how he's feeling overall? If continued trouble voiding, will need to come in for labwork to check kidney function. Lab Results  Component Value Date   CREATININE 1.05 01/31/2012

## 2012-03-06 NOTE — Telephone Encounter (Signed)
Caller: Cristin/Patient; PCP: [name]; CB#: [phone]; Call regarding Swelling; Pt has increased edema. Voided x 1 in 24 hrs. He is going to increase Torsemide to Torsemide 40 mg in AM and Torsemide 20mg  in the afternoon. Pt declines triage, emergent sx r/o. He would like a note sent to Dr. Reece Agar.

## 2012-03-07 NOTE — Telephone Encounter (Signed)
Per Myer Peer, RN  Caller: [name]/Patient; PCP: [name]; CB#: [phone]; ; ; Call regarding Returning A. Call;  Pt states he is returning a call to Selena Batten, Dr. Sharen Hones nurse.

## 2012-03-07 NOTE — Telephone Encounter (Signed)
Message left for patient to return my call.  

## 2012-03-08 ENCOUNTER — Other Ambulatory Visit (INDEPENDENT_AMBULATORY_CARE_PROVIDER_SITE_OTHER): Payer: Medicare Other

## 2012-03-08 DIAGNOSIS — I509 Heart failure, unspecified: Secondary | ICD-10-CM

## 2012-03-08 DIAGNOSIS — I5040 Unspecified combined systolic (congestive) and diastolic (congestive) heart failure: Secondary | ICD-10-CM

## 2012-03-08 LAB — BASIC METABOLIC PANEL
BUN: 30 mg/dL — ABNORMAL HIGH (ref 6–23)
CO2: 31 mEq/L (ref 19–32)
Calcium: 9.9 mg/dL (ref 8.4–10.5)
Chloride: 96 mEq/L (ref 96–112)
Creatinine, Ser: 1.5 mg/dL (ref 0.4–1.5)

## 2012-03-08 NOTE — Telephone Encounter (Signed)
Spoke with patient. He said he is feeling better. His weight is 376.9 today and he is not having any trouble voiding. He has increased the torsemide to 40 mg BID though. I advised I would call him back if you wanted him to do anything different.

## 2012-03-08 NOTE — Telephone Encounter (Signed)
Patient notified and lab appt scheduled for today.  

## 2012-03-08 NOTE — Telephone Encounter (Signed)
If stays on 40mg  bid, needs to come in for blood work to check kidney function.  Placed order in chart.

## 2012-03-11 ENCOUNTER — Encounter: Payer: Self-pay | Admitting: Family Medicine

## 2012-03-12 ENCOUNTER — Encounter: Payer: Self-pay | Admitting: Cardiovascular Disease

## 2012-03-13 ENCOUNTER — Ambulatory Visit (INDEPENDENT_AMBULATORY_CARE_PROVIDER_SITE_OTHER): Payer: Medicare Other | Admitting: Cardiovascular Disease

## 2012-03-13 ENCOUNTER — Encounter: Payer: Self-pay | Admitting: Cardiovascular Disease

## 2012-03-13 VITALS — BP 111/56 | HR 71 | Ht 73.0 in | Wt 383.2 lb

## 2012-03-13 DIAGNOSIS — H579 Unspecified disorder of eye and adnexa: Secondary | ICD-10-CM

## 2012-03-13 DIAGNOSIS — E785 Hyperlipidemia, unspecified: Secondary | ICD-10-CM

## 2012-03-13 DIAGNOSIS — I10 Essential (primary) hypertension: Secondary | ICD-10-CM

## 2012-03-13 DIAGNOSIS — E1165 Type 2 diabetes mellitus with hyperglycemia: Secondary | ICD-10-CM

## 2012-03-13 DIAGNOSIS — I5040 Unspecified combined systolic (congestive) and diastolic (congestive) heart failure: Secondary | ICD-10-CM

## 2012-03-13 DIAGNOSIS — E1139 Type 2 diabetes mellitus with other diabetic ophthalmic complication: Secondary | ICD-10-CM

## 2012-03-13 DIAGNOSIS — R0602 Shortness of breath: Secondary | ICD-10-CM

## 2012-03-13 DIAGNOSIS — I509 Heart failure, unspecified: Secondary | ICD-10-CM

## 2012-03-13 NOTE — Assessment & Plan Note (Signed)
We have encouraged continued exercise, careful diet management in an effort to lose weight. He will likely need modification of his insulin as he continues to run high in the morning.

## 2012-03-13 NOTE — Assessment & Plan Note (Signed)
Blood pressure is well controlled on today's visit. No changes made to the medications. 

## 2012-03-13 NOTE — Assessment & Plan Note (Signed)
He is not interested in attending. He'll continue to work with diet and exercise for weight loss.

## 2012-03-13 NOTE — Patient Instructions (Signed)
You are doing well. No medication changes were made.  Monitor your weight Goal water weight is 378 to 380 lbs We can check your renal function every few months  Please call us if you have new issues that need to be addressed before your next appt.  Your physician wants you to follow-up in: 6 months.  You will receive a reminder letter in the mail two months in advance. If you don't receive a letter, please call our office to schedule the follow-up appointment.

## 2012-03-13 NOTE — Assessment & Plan Note (Signed)
He appears to have reached a point where he is euvolemic, possibly even mildly dehydrated with borderline elevated creatinine and BUN. We have suggested that he continue on torsemide 40 mg with 20 mg after lunch to avoid nocturnal polyuria. His weight continues to decrease, we have suggested he hold the afternoon dose. He could increase the dose to 40 mg twice a day for any weight gain and above 380 pounds.  Recent climb in his creatinine and BUN is likely secondary to mild hypovolemia.

## 2012-03-13 NOTE — Progress Notes (Signed)
Patient ID: [name], male    DOB: [birth year], 66 y.o.   MRN: [id number]  HPI Comments: 66 year old male with morbid obesity , coronary artery disease with bare-metal stent placed to the LAD after MI,  admission to [facility] for chest pain and dyspnea, found to have diastolic CHF.  He was successfully diuresed.  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  He presents for routine followup.  He has been taking  torsemide  40 mg in the morning and 20 mg in the evening.  On this regimen, his weight has continued to decrease. He has less lower extremity edema, less shortness of breath. In general, he sleeps in a chair most of the time. When he sleeps flat, he has incontinence. He denies any significant chest pain.   Recent lab work shows running 1.5, BUN 30.  Weight at home is 378-380 pounds. This is down approximately 20 pounds or more from what he had significant shortness of breath and chest tightness.  He reports that he is no longer interested in laparoscopic banding. He does not want the cost or complications. He is interested in other pills that might help him lose weight. His glucose levels have been elevated, sometimes in the 300-400 range.  a. s/p remote MI  w/ BMS LAD @ [facility];  b. cath 09/2006 - residual disease including medically managed  distal circumflex stenosis with left and right collaterals and a 30% to 40% in stent restenosis;  c. Myoview 01/2012 EF 45%, mid anterior to apical & mid inferior to apical infarct.  No  ischemia.   EKG shows normal sinus rhythm with no significant ST or T wave changes  Outpatient Encounter Prescriptions as of 03/13/2012  Medication Sig Dispense Refill  . ALPRAZolam (XANAX) 1 MG tablet Take 1 tablet (1 mg total) by mouth at bedtime as needed.  31 tablet  0  . aspirin 325 MG EC tablet Take 325 mg by mouth daily.        . Cholecalciferol (VITAMIN D3) 1000 UNITS CAPS Take 1 tablet  by mouth daily.        Marland Kitchen gabapentin (NEURONTIN) 300 MG capsule Take 2 capsules (600 mg total) by mouth 3 (three) times daily.  180 capsule  11  . insulin NPH-insulin regular (NOVOLIN 70/30) (70-30) 100 UNIT/ML injection Inject 50 Units into the skin 2 (two) times daily with a meal.  30 mL  11  . insulin regular (NOVOLIN R,HUMULIN R) 100 units/mL injection SSI (25u TID AC) - humulin  20 mL  11  . levothyroxine (SYNTHROID, LEVOTHROID) 112 MCG tablet Take 2 tablets (224 mcg total) by mouth daily.  180 tablet  3  . lisinopril (PRINIVIL,ZESTRIL) 20 MG tablet Take 1 tablet (20 mg total) by mouth daily.  90 tablet  3  . meloxicam (MOBIC) 15 MG tablet TAKE 1 TABLET (15 MG TOTAL) BY MOUTH DAILY AS NEEDED FOR PAIN.  30 tablet  0  . metFORMIN (GLUCOPHAGE) 1000 MG tablet Take 1 tablet (1,000 mg total) by mouth 2 (two) times daily with a meal. And 1/2 at lunchtime  225 tablet  3  . nitroGLYCERIN (NITROSTAT) 0.4 MG SL tablet Use as directed       . oxyCODONE-acetaminophen (PERCOCET) 5-325 MG per tablet Take 2 tablets by mouth every 8 (eight) hours as needed for pain.  150 tablet  0  . simvastatin (ZOCOR) 40 MG tablet TAKE 1  TABLET BY MOUTH AT BEDTIME FOR CHOLESTEROL  30 tablet  11  . torsemide (DEMADEX) 20 MG tablet 40mg  in am and 20mg  in afternoon  90 tablet  11  . metoprolol tartrate (LOPRESSOR) 25 MG tablet Take 0.5 tablets (12.5 mg total) by mouth 2 (two) times daily.  90 tablet  3    Review of Systems  Constitutional: Negative.   HENT: Negative.   Eyes: Negative.   Respiratory: Negative.   Cardiovascular: Negative.   Gastrointestinal: Negative.   Musculoskeletal: Negative.   Skin: Negative.   Neurological: Negative.   Hematological: Negative.   Psychiatric/Behavioral: Negative.   All other systems reviewed and are negative.     Pulse 71  Ht 6\' 1"  (1.854 m)  Wt 383 lb 4 oz (173.841 kg)  BMI 50.56 kg/m2  Physical Exam  Nursing note and vitals reviewed. Constitutional: He is oriented to  person, place, and time. He appears well-developed and well-nourished.       Morbidly obese  HENT:  Head: Normocephalic.  Nose: Nose normal.  Mouth/Throat: Oropharynx is clear and moist.  Eyes: Conjunctivae are normal. Pupils are equal, round, and reactive to light.  Neck: Normal range of motion. Neck supple. No JVD present.  Cardiovascular: Normal rate, regular rhythm, S1 normal, S2 normal, normal heart sounds and intact distal pulses.  Exam reveals no gallop and no friction rub.   No murmur heard. Pulmonary/Chest: Effort normal and breath sounds normal. No respiratory distress. He has no wheezes. He has no rales. He exhibits no tenderness.  Abdominal: Soft. Bowel sounds are normal. He exhibits no distension. There is no tenderness.  Musculoskeletal: Normal range of motion. He exhibits no edema and no tenderness.  Lymphadenopathy:    He has no cervical adenopathy.  Neurological: He is alert and oriented to person, place, and time. Coordination normal.  Skin: Skin is warm and dry. No rash noted. No erythema.  Psychiatric: He has a normal mood and affect. His behavior is normal. Judgment and thought content normal.           Assessment and Plan

## 2012-03-13 NOTE — Assessment & Plan Note (Signed)
I suspect with improved diabetes control, his cholesterol also improved. I suggested he stay on his current dose of simvastatin.

## 2012-03-22 ENCOUNTER — Ambulatory Visit (INDEPENDENT_AMBULATORY_CARE_PROVIDER_SITE_OTHER): Payer: Medicare Other | Admitting: Family Medicine

## 2012-03-22 ENCOUNTER — Encounter: Payer: Self-pay | Admitting: Family Medicine

## 2012-03-22 VITALS — BP 96/64 | HR 84 | Temp 98.5°F | Wt 387.0 lb

## 2012-03-22 DIAGNOSIS — H579 Unspecified disorder of eye and adnexa: Secondary | ICD-10-CM

## 2012-03-22 DIAGNOSIS — E1139 Type 2 diabetes mellitus with other diabetic ophthalmic complication: Secondary | ICD-10-CM

## 2012-03-22 DIAGNOSIS — R7309 Other abnormal glucose: Secondary | ICD-10-CM

## 2012-03-22 DIAGNOSIS — M79609 Pain in unspecified limb: Secondary | ICD-10-CM

## 2012-03-22 DIAGNOSIS — I5040 Unspecified combined systolic (congestive) and diastolic (congestive) heart failure: Secondary | ICD-10-CM

## 2012-03-22 DIAGNOSIS — R739 Hyperglycemia, unspecified: Secondary | ICD-10-CM

## 2012-03-22 DIAGNOSIS — M79605 Pain in left leg: Secondary | ICD-10-CM

## 2012-03-22 DIAGNOSIS — I509 Heart failure, unspecified: Secondary | ICD-10-CM

## 2012-03-22 LAB — GLUCOSE, POCT (MANUAL RESULT ENTRY): POC Glucose: 175 mg/dl — AB (ref 70–99)

## 2012-03-22 MED ORDER — CYCLOBENZAPRINE HCL 10 MG PO TABS: 10.0000 mg | ORAL_TABLET | Freq: Three times a day (TID) | ORAL | Status: AC | PRN

## 2012-03-22 MED ORDER — ALPRAZOLAM 1 MG PO TABS: 1.0000 mg | ORAL_TABLET | Freq: Every evening | ORAL | Status: DC | PRN

## 2012-03-22 MED ORDER — OXYCODONE-ACETAMINOPHEN 5-325 MG PO TABS: 2.0000 | ORAL_TABLET | Freq: Three times a day (TID) | ORAL | Status: DC | PRN

## 2012-03-22 NOTE — Assessment & Plan Note (Signed)
Deteriorated.   However I just learned today pt has been mixing insulins today - this could be accounting for poor sugar control as mixing will lead to degradation of both insulin forms. I think syncopal event due to hypoglycemia as took total of 100 units of insulin at once because "sugars too high" Again discussed concept of daily basal insulin and mealtime insulin. rec stop mixing, separate injections (5 total daily) and to keep track of cbg's QID (AC/HS) and call me in 1 wk with numbers to titrate sugars accordingly. Goal for him A1c <8%. Lab Results  Component Value Date   HGBA1C 9.0* 02/19/2012

## 2012-03-22 NOTE — Patient Instructions (Addendum)
Last few blood pressure readings:  BP Readings from Last 3 Encounters:  03/22/12 96/64  03/13/12 111/56  02/23/12 110/60  I want you to decrease lisinopril to 10mg  daily (1/2 tablet daily). For diabetes - continue metformin. Inject novolog 70/30 separate from humulin R. Call me with log of sugars in 1 week and we will titrate accordingly. For leg could be groin strain - treat with flexeril.  If not improving return for xrays.

## 2012-03-22 NOTE — Assessment & Plan Note (Signed)
After fall. Anticipate groin strain.  Some pain at hip - however declines hip xrays today. Treat with flexeril - discussed sedation precautions. if not improved, low threshold to image lower leg. Able to ambulate well.

## 2012-03-22 NOTE — Assessment & Plan Note (Signed)
With hypotension, decrease lisinopril to 10mg  daily.

## 2012-03-22 NOTE — Progress Notes (Signed)
  Subjective:    Patient ID: [name], male    DOB: 1946-07-29, 66 y.o.   MRN: [id number]  HPI CC: hyperglycemia  2 d ago sugar was "high" so mixed 50 units 70/30 novolog with 50 units of humulin R (in same syringe) and injected into abdomen.  1 hour afterwards very weak and passed out.  Then went to walk outside and fell again - hit right forearm and left back/leg.  Thinks twisted left leg.  Worried pulled groin.  Back pain midline as well as traveling down posterior left leg.  Did not call 911.  First time has ever blacked out.    No HA.  Sugars too high.  200s-400s.   Increased percocet use with fall.  Other than 2 d ago denies dizziness, headaches.  Has been injecting mixed 70/30 with humulin R.  Has been mixing insulin since started insulin shots years ago.  Wt Readings from Last 3 Encounters:  03/22/12 387 lb (175.542 kg)  03/13/12 383 lb 4 oz (173.841 kg)  02/23/12 383 lb 8 oz (173.954 kg)    BP Readings from Last 3 Encounters:  03/22/12 96/64  03/13/12 111/56  02/23/12 110/60   Lab Results  Component Value Date   CREATININE 1.5 03/08/2012    Review of Systems Per HPI    Objective:   Physical Exam  Nursing note and vitals reviewed. Constitutional: He appears well-developed and well-nourished. No distress.  HENT:  Head: Normocephalic and atraumatic.  Mouth/Throat: Oropharynx is clear and moist. No oropharyngeal exudate.  Cardiovascular: Normal rate, regular rhythm and intact distal pulses.   Murmur (3/6 SEM) heard. Pulmonary/Chest: Effort normal and breath sounds normal. No respiratory distress. He has no wheezes. He has no rales.  Abdominal: Soft. Bowel sounds are normal. He exhibits distension. He exhibits no mass. There is tenderness. There is no rebound and no guarding.  Musculoskeletal:       Tender to palpation anterior and posterior thigh. No pain at GTB or SIJ.  Tender at sciatic notch and lumbar spine midline. No ecchymosis. Tender with rotation of  hip but localized to lower thigh.  Skin: Skin is warm and dry. No rash noted.  Psychiatric: He has a normal mood and affect.       Assessment & Plan:  printed xanax and percocet prescriptions to fill after 04/05/2012.

## 2012-03-28 ENCOUNTER — Telehealth: Payer: Self-pay

## 2012-03-28 NOTE — Telephone Encounter (Signed)
Prior auth needed for Cyclobenzaprine. Form is in your in box.

## 2012-03-29 NOTE — Telephone Encounter (Signed)
Forms faxed and given to [facility]. Will await response.

## 2012-03-29 NOTE — Telephone Encounter (Signed)
Filled and placed in Kim's box. 

## 2012-04-04 NOTE — Telephone Encounter (Signed)
Received denial letter for flexeril. Letter faxed to [facility] D[facility] on Dr Timoteo Expose in box for signing and scanning.

## 2012-04-05 ENCOUNTER — Telehealth: Payer: Self-pay | Admitting: *Deleted

## 2012-04-05 MED ORDER — INSULIN NPH ISOPHANE & REGULAR (70-30) 100 UNIT/ML ~~LOC~~ SUSP: 100.0000 [IU] | Freq: Two times a day (BID) | SUBCUTANEOUS | Status: DC

## 2012-04-05 MED ORDER — TIZANIDINE HCL 4 MG PO TABS: 4.0000 mg | ORAL_TABLET | Freq: Two times a day (BID) | ORAL | Status: AC | PRN

## 2012-04-05 MED ORDER — INSULIN REGULAR HUMAN 100 UNIT/ML IJ SOLN: INTRAMUSCULAR | Status: DC

## 2012-04-05 NOTE — Telephone Encounter (Signed)
Patient called with his sugar log:  May 18- AM= 344 Noon=287  PM=196 May 19- AM= 277 Noon=309 PM=281 May 20- AM= 287 Noon=257 PM=196 May 21- AM=204 Noon=227 PM=118 May 22- AM=220 Noon=386 PM=184 May 23- AM=192 Noon=207 PM=219 (ran out of "R" at lunch) May 24- AM=212 Noon=167 PM=181 May 25- AM=156 Noon=198 PM=184 (ran out of 70/30 at lunch) May 26- AM=297 Noon=306 PM=382  **Patient asks that you send in more insulin because he is running out before the end of the month. He is currently using potentially 250 units of 70/30 daily (100 units every AM, 25-50 units at lunch and 100 units every PM) and 75 units of R daily (25 units TID PRN). He needs enough to last him a month because the quantity he is currently getting isn't lasting and insurance won't pay for early refills.

## 2012-04-05 NOTE — Telephone Encounter (Signed)
Notify flexeril was denied so have sent in tizanidine to try for muscle spasm.

## 2012-04-05 NOTE — Telephone Encounter (Signed)
Patient notified and verbalized understanding. He repeated dosing back to me 3 times during our conversation. He's not sure of the exact needle length, but will call me on Monday and let me know. He prefers an endo dr in [place].

## 2012-04-05 NOTE — Telephone Encounter (Signed)
Thank him for keeping this log - very helpful. Our plan was to use 50 units of 70/30 in morning and at dinner time, and 25 units of regular three times a day prior to meals. This is not what he is doing.   70/30 should just be twice a day, he's taking 3 times a day. I have sent in refills of both insulins but want him to take 100 units of 70/30 twice a day (not 3 times a day).  Take 25 units of R with meals. I would like to refer to endocrinology for stronger insulin use and further education. What length needles is he using to inject insulin.

## 2012-04-09 ENCOUNTER — Telehealth: Payer: Self-pay | Admitting: *Deleted

## 2012-04-09 NOTE — Telephone Encounter (Signed)
Patient called and said the length of his needles were 1/2 inch for his 70/30 and 5/16 for his "R".

## 2012-04-19 ENCOUNTER — Telehealth: Payer: Self-pay | Admitting: *Deleted

## 2012-04-19 NOTE — Telephone Encounter (Signed)
Received refill request for Humulin R because patient had increased his insulin on his own and was going to run out. Spoke with patient and he said his sugars had been running between 230-340 with dosing the way you had instructed. He has since increased the "R" dosing to 50 U Q AM, 25 U at lunch and 50 U at supper and sugars have been running 110-160. He said he has enough insulin to last him until next Tuesday and would wait to hear back from you if this dosing was okay. I advised you would not be back in the office until then and if he ran out or had problem before then to call me back and I would have another doctor look at his chart. He verbalized understanding.

## 2012-04-20 MED ORDER — INSULIN NPH ISOPHANE & REGULAR (70-30) 100 UNIT/ML ~~LOC~~ SUSP: 110.0000 [IU] | Freq: Two times a day (BID) | SUBCUTANEOUS | Status: DC

## 2012-04-20 MED ORDER — INSULIN REGULAR HUMAN 100 UNIT/ML IJ SOLN: INTRAMUSCULAR | Status: DC

## 2012-04-20 NOTE — Telephone Encounter (Signed)
Lets increase regular insulin to 30 u with breakfast, 25 u with lunch, and 30 u with dinner. I also want to increase novolog 70/30 to 110 units bid every day. Sent in new doses. Call me in 1 wk with update.  Monitor sugars closely to ensure no lows.

## 2012-04-22 NOTE — Telephone Encounter (Signed)
Patient notified and repeated instructions x 3. He will call me on Monday with an update and monitor for lows.

## 2012-04-29 ENCOUNTER — Other Ambulatory Visit: Payer: Self-pay | Admitting: *Deleted

## 2012-04-29 NOTE — Telephone Encounter (Signed)
Patient also said [place] is [place]. He was asking if he could possibly have an early fill on Rx's dated for the 29th so he wouldn't have to try to get meds filled out of town.

## 2012-04-29 NOTE — Telephone Encounter (Signed)
Patient called with log of sugars since starting new dose of insulins:  June 17th PM= 197 June 18th AM= 119 Noon=187  PM=123 June 19th AM= 96 (felt fine) Noon=198 PM 164  June 20th AM= 127 Noon= 166 PM=204  June 21st AM= 138 Noon= 202 PM=186 June 22nd AM= 136 Noon= 192 PM= 172 June 23rd AM= 97 Noon= 160 PM=129 June 24th AM= 112  Noon= 118

## 2012-05-01 MED ORDER — OXYCODONE-ACETAMINOPHEN 5-325 MG PO TABS: 2.0000 | ORAL_TABLET | Freq: Three times a day (TID) | ORAL | Status: DC | PRN

## 2012-05-01 MED ORDER — INSULIN NPH ISOPHANE & REGULAR (70-30) 100 UNIT/ML ~~LOC~~ SUSP: SUBCUTANEOUS | Status: DC

## 2012-05-01 MED ORDER — ALPRAZOLAM 1 MG PO TABS: 1.0000 mg | ORAL_TABLET | Freq: Every evening | ORAL | Status: DC | PRN

## 2012-05-01 NOTE — Telephone Encounter (Signed)
Placed in Kim's box. 

## 2012-05-01 NOTE — Telephone Encounter (Signed)
Patient notified and will increase insulin as directed. Also, please reprint Rx to say fill on 05-04-12 instead of 04-03-12. Thanks!

## 2012-05-01 NOTE — Telephone Encounter (Signed)
Placed up front for patient pick up

## 2012-05-01 NOTE — Telephone Encounter (Signed)
Ok to fill early - placed script in kim's box.  For sugars - numbers are much better.  is he checking always prior to meal?  If so, PM dose of 110 is good.  I would like to increase AM dose to 112 units as noon and pm values are consistently a bit high.  continue to keep log and call me in 1-2 wks with update

## 2012-05-02 ENCOUNTER — Telehealth: Payer: Self-pay | Admitting: Family Medicine

## 2012-05-02 NOTE — Telephone Encounter (Signed)
Filled and placed in Kim's box. 

## 2012-05-02 NOTE — Telephone Encounter (Signed)
Pt dropped off form for Handi-Cap Sticker. He lives [place] ways from the office and wanted to know if we could put in in the mail to send to him when it is completed. I put the form in your inbox.

## 2012-05-08 NOTE — Telephone Encounter (Signed)
Form mailed to patient per Lyla Son.

## 2012-05-22 ENCOUNTER — Encounter: Payer: Self-pay | Admitting: Family Medicine

## 2012-05-22 ENCOUNTER — Ambulatory Visit (INDEPENDENT_AMBULATORY_CARE_PROVIDER_SITE_OTHER): Payer: Medicare Other | Admitting: Family Medicine

## 2012-05-22 VITALS — BP 126/64 | HR 80 | Temp 98.0°F | Wt 381.8 lb

## 2012-05-22 DIAGNOSIS — I5040 Unspecified combined systolic (congestive) and diastolic (congestive) heart failure: Secondary | ICD-10-CM

## 2012-05-22 DIAGNOSIS — E039 Hypothyroidism, unspecified: Secondary | ICD-10-CM

## 2012-05-22 DIAGNOSIS — H579 Unspecified disorder of eye and adnexa: Secondary | ICD-10-CM

## 2012-05-22 DIAGNOSIS — E1139 Type 2 diabetes mellitus with other diabetic ophthalmic complication: Secondary | ICD-10-CM

## 2012-05-22 DIAGNOSIS — I252 Old myocardial infarction: Secondary | ICD-10-CM

## 2012-05-22 DIAGNOSIS — I10 Essential (primary) hypertension: Secondary | ICD-10-CM

## 2012-05-22 LAB — BASIC METABOLIC PANEL
Calcium: 9.8 mg/dL (ref 8.4–10.5)
GFR: 65.75 mL/min (ref >60.00)
Potassium: 4 mEq/L (ref 3.5–5.1)
Sodium: 140 mEq/L (ref 135–145)

## 2012-05-22 MED ORDER — OXYCODONE-ACETAMINOPHEN 5-325 MG PO TABS: 2.0000 | ORAL_TABLET | Freq: Three times a day (TID) | ORAL | Status: DC | PRN

## 2012-05-22 MED ORDER — ALPRAZOLAM 1 MG PO TABS: 1.0000 mg | ORAL_TABLET | Freq: Every evening | ORAL | Status: DC | PRN

## 2012-05-22 MED ORDER — INSULIN NPH ISOPHANE & REGULAR (70-30) 100 UNIT/ML ~~LOC~~ SUSP: SUBCUTANEOUS | Status: DC

## 2012-05-22 MED ORDER — TIZANIDINE HCL 4 MG PO TABS: 4.0000 mg | ORAL_TABLET | Freq: Three times a day (TID) | ORAL | Status: AC | PRN

## 2012-05-22 MED ORDER — "INSULIN SYRINGE-NEEDLE U-100 29G X 1/2"" 2 ML MISC": Status: AC

## 2012-05-22 NOTE — Assessment & Plan Note (Signed)
asxs. 

## 2012-05-22 NOTE — Assessment & Plan Note (Signed)
Seems euvolemic on exam today.  Check kidney function as prior check bumped to 1.5 Continue lower dose of 40mg  daily. Continues to lose weight. If Cr stable, this may be dry weight. Wt Readings from Last 3 Encounters:  05/22/12 381 lb 12 oz (173.161 kg)  03/22/12 387 lb (175.542 kg)  03/13/12 383 lb 4 oz (173.841 kg)

## 2012-05-22 NOTE — Patient Instructions (Addendum)
I've sent in a bigger syringe.  Start at 1.1 mL (which is 110 units of humulin 70/30). Blood work today. Schedule vision screen as you're due. Keep an eye on feet - lotrimin between right last 2 toes - keep as dry as able Return in 3 months for followup, bring log of sugars at next appointment.

## 2012-05-22 NOTE — Assessment & Plan Note (Signed)
Lab Results  Component Value Date   TSH 0.89 02/19/2012

## 2012-05-22 NOTE — Progress Notes (Signed)
  Subjective:    Patient ID: [name], male    DOB: [birth year], 66 y.o.   MRN: [id number]  HPI CC: 51mo f/u DM  "I'm doing great!"  Father recently with stroke, pt spending more time with him.  66 yo with h/o T2DM, morbid obesity, HTN, HLD, GERD, chronic pain, CHF, and CAD s/p stent 1999 and last catheterization 2007 with residual disease and h/o prostate cancer s/p radical prostatectomy presents for diabetes check.  Last medicare wellness visit was 11/2011.  DM - significant confusion with insulin use in past.  Actually much improved sugar control.  Self titrated regular up.  Has been using 1 cc syringe so unsure exactly how much 70/30 insulin he's administering.  Fasting last few days has been 90-110.  No low sugars, no hypoglycemic episodes.  Due for vision exam.  Foot exam 02/2012.  Usually checks sugars once in am.  If running abnormal, checks 2-3 times daily. Lab Results  Component Value Date   HGBA1C 9.0* 02/19/2012   CHF - last visit backed off torsemide to 40mg  qam (no longer noon 20mg  dose) 2/2 hypotension.  Weight stable (actual 6lb drop since last visit).  No SOB, leg swelling. Wt Readings from Last 3 Encounters:  05/22/12 381 lb 12 oz (173.161 kg)  03/22/12 387 lb (175.542 kg)  03/13/12 383 lb 4 oz (173.841 kg)    BP Readings from Last 3 Encounters:  05/22/12 126/64  03/22/12 96/64  03/13/12 111/56   CAD - no recent CP/tightness, SOB.  HTN - better bp - no lows.  No dizziness, no headaches or presyncope.  Review of Systems Per HPI    Objective:   Physical Exam  Nursing note and vitals reviewed. Constitutional: He appears well-developed and well-nourished. No distress.  HENT:  Head: Normocephalic and atraumatic.  Right Ear: External ear normal.  Left Ear: External ear normal.  Nose: Nose normal.  Mouth/Throat: Oropharynx is clear and moist. No oropharyngeal exudate.  Eyes: Conjunctivae and EOM are normal. Pupils are equal, round, and reactive to light. No  scleral icterus.  Neck: Normal range of motion. Neck supple. Carotid bruit is not present.  Cardiovascular: Normal rate, regular rhythm and intact distal pulses.   Murmur (2/6 SEM) heard. Pulmonary/Chest: Effort normal and breath sounds normal. No respiratory distress. He has no wheezes. He has no rales.  Musculoskeletal: He exhibits no edema.        Diabetic foot exam: Hemosiderin deposits bilaterally No skin breakdown No calluses  Diminished DP/PT pulses Normal sensation to light touch and decreased to monofilament bilaterally L>R Nails normal  Some maceration last interdigital space of right foot  Lymphadenopathy:    He has no cervical adenopathy.  Skin: Skin is warm and dry. No rash noted.  Psychiatric: He has a normal mood and affect.       Assessment & Plan:

## 2012-05-22 NOTE — Assessment & Plan Note (Signed)
Well controlled today.  On lisinopril 10mg  daily and torsemide 40mg  in am, as well as 1/2 pill of metoprolol bid.

## 2012-05-22 NOTE — Assessment & Plan Note (Addendum)
Per pt report anticipate significant improved control. Check A1c today. Sent in 2mL syringes for better approximation of insulin use.  Discussed start 70/30 at 110 u BID, may titrate up by 1 unit if 3d sugar average high, but max will be 115u. Watch Cr as on metformin. Recommended lotrimin for some maceration between 4th/5th toes R foot, and keeping interdigital web as dry as possible.

## 2012-07-03 ENCOUNTER — Other Ambulatory Visit: Payer: Self-pay

## 2012-07-03 NOTE — Telephone Encounter (Addendum)
Printed percocets and xanax.

## 2012-07-03 NOTE — Telephone Encounter (Signed)
Pt left v/m requesting rx percocet and alprazolam. Call when ready for pick up.

## 2012-07-04 MED ORDER — ALPRAZOLAM 1 MG PO TABS: 1.0000 mg | ORAL_TABLET | Freq: Every evening | ORAL | Status: DC | PRN

## 2012-07-04 MED ORDER — OXYCODONE-ACETAMINOPHEN 5-325 MG PO TABS: 2.0000 | ORAL_TABLET | Freq: Three times a day (TID) | ORAL | Status: DC | PRN

## 2012-07-04 NOTE — Telephone Encounter (Signed)
Patient called to check on status and Asher Muir advised they were ready for pick up. Given to [place] to give to patient.

## 2012-07-07 DIAGNOSIS — H409 Unspecified glaucoma: Secondary | ICD-10-CM

## 2012-07-07 DIAGNOSIS — C220 Liver cell carcinoma: Secondary | ICD-10-CM

## 2012-07-07 DIAGNOSIS — R160 Hepatomegaly, not elsewhere classified: Secondary | ICD-10-CM | POA: Insufficient documentation

## 2012-07-07 HISTORY — DX: Liver cell carcinoma: C22.0

## 2012-07-07 HISTORY — DX: Unspecified glaucoma: H40.9

## 2012-07-09 ENCOUNTER — Other Ambulatory Visit: Payer: Self-pay | Admitting: Family Medicine

## 2012-07-10 ENCOUNTER — Other Ambulatory Visit: Payer: Self-pay
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
## 2012-07-11 MED ORDER — TRAMADOL HCL 50 MG PO TABS: 50.0000 mg | ORAL_TABLET | Freq: Three times a day (TID) | ORAL | Status: AC | PRN

## 2012-07-11 MED ORDER — TORSEMIDE 20 MG PO TABS: 40.0000 mg | ORAL_TABLET | Freq: Every day | ORAL | Status: AC

## 2012-07-11 MED ORDER — ALPRAZOLAM 1 MG PO TABS: 1.0000 mg | ORAL_TABLET | Freq: Every evening | ORAL | Status: DC | PRN
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
## 2012-07-11 NOTE — Telephone Encounter (Signed)
plz notify I won't refill percocets early but may call in xanax. I'd like him to use tramadol prn pain instead of percocets for this month. What steps is he taking to prevent this from happening again?  - needs to keep meds locked up. Sent in torsemide.

## 2012-07-15 LAB — CBC
HCT: 41.8 % (ref 40.0–52.0)
HGB: 14 g/dL (ref 13.0–18.0)
MCH: 26.8 pg (ref 26.0–34.0)
MCHC: 33.4 g/dL (ref 32.0–36.0)
MCV: 81 fL (ref 80–100)

## 2012-07-15 LAB — URINALYSIS, COMPLETE
Bilirubin,UR: NEGATIVE
Blood: NEGATIVE
Glucose,UR: NEGATIVE mg/dL (ref 0–75)
Hyaline Cast: 4
Ketone: NEGATIVE
Leukocyte Esterase: NEGATIVE
Nitrite: NEGATIVE
Ph: 5 (ref 4.5–8.0)
Protein: 30
RBC,UR: <1 /HPF (ref 0–5)
Specific Gravity: 1.015 (ref 1.003–1.030)
Squamous Epithelial: 2
WBC UR: 1 /HPF (ref 0–5)

## 2012-07-15 LAB — COMPREHENSIVE METABOLIC PANEL
Albumin: 3.6 g/dL (ref 3.4–5.0)
Alkaline Phosphatase: 102 U/L (ref 50–136)
Bilirubin,Total: 0.5 mg/dL (ref 0.2–1.0)
Calcium, Total: 9.6 mg/dL (ref 8.5–10.1)
Creatinine: 1.21 mg/dL (ref 0.60–1.30)
EGFR (African American): >60
EGFR (Non-African Amer.): >60
Glucose: 161 mg/dL — ABNORMAL HIGH (ref 65–99)
Osmolality: 280 (ref 275–301)
SGPT (ALT): 34 U/L (ref 12–78)
Sodium: 137 mmol/L (ref 136–145)

## 2012-07-15 LAB — PROTIME-INR
INR: 1
Prothrombin Time: 13.3 s (ref 11.5–14.7)

## 2012-07-15 LAB — LIPASE, BLOOD: Lipase: 200 U/L (ref 73–393)

## 2012-07-16 ENCOUNTER — Observation Stay: Payer: Self-pay | Admitting: Internal Medicine

## 2012-07-18 LAB — CEA: CEA: 2.7 ng/mL (ref 0.0–4.7)

## 2012-07-21 ENCOUNTER — Encounter: Payer: Self-pay | Admitting: Family Medicine

## 2012-07-24 ENCOUNTER — Ambulatory Visit: Payer: Self-pay | Admitting: Gastroenterology

## 2012-07-25 ENCOUNTER — Ambulatory Visit: Payer: Medicare Other | Admitting: Family Medicine

## 2012-07-26 ENCOUNTER — Ambulatory Visit (INDEPENDENT_AMBULATORY_CARE_PROVIDER_SITE_OTHER): Payer: Medicare Other | Admitting: Family Medicine

## 2012-07-26 ENCOUNTER — Encounter: Payer: Self-pay | Admitting: Family Medicine

## 2012-07-26 VITALS — BP 130/68 | HR 84 | Temp 98.7°F | Wt 380.2 lb

## 2012-07-26 DIAGNOSIS — K769 Liver disease, unspecified: Secondary | ICD-10-CM

## 2012-07-26 DIAGNOSIS — F322 Major depressive disorder, single episode, severe without psychotic features: Secondary | ICD-10-CM

## 2012-07-26 DIAGNOSIS — R160 Hepatomegaly, not elsewhere classified: Secondary | ICD-10-CM

## 2012-07-26 DIAGNOSIS — G894 Chronic pain syndrome: Secondary | ICD-10-CM

## 2012-07-26 MED ORDER — OXYCODONE-ACETAMINOPHEN 5-325 MG PO TABS: 2.0000 | ORAL_TABLET | Freq: Three times a day (TID) | ORAL | Status: DC | PRN

## 2012-07-26 MED ORDER — SERTRALINE HCL 50 MG PO TABS: 50.0000 mg | ORAL_TABLET | Freq: Every day | ORAL | Status: AC

## 2012-07-26 MED ORDER — ALPRAZOLAM 1 MG PO TABS: 1.0000 mg | ORAL_TABLET | Freq: Every evening | ORAL | Status: DC | PRN

## 2012-07-26 NOTE — Assessment & Plan Note (Signed)
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 RUQ pain thought stemming from this. Saw Dr. Bluford Kaufmann this week then had open MRI done yesterday - no report available yet. Discussing referral to liver specialist. Encouraged to pursue this, discussed taking 1 day at a time. Per D/C summary, CMP in hospital WNL.

## 2012-07-26 NOTE — Assessment & Plan Note (Signed)
Relapsed depression with passing SI. Stressed from new liver finding. Discussed taking 1 day at a time, provided support. Pt interested in starting zoloft again - had prior responded well to this. Discussed at length risk of increased suicidality with this med, and pt contracts for safety.  If any worsening SI/bad thoughts, he is to stop med and immediately call me or call 911.

## 2012-07-26 NOTE — Assessment & Plan Note (Signed)
Printed out prescription for percocets #150 to fill after 08/02/2012.
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
## 2012-07-26 NOTE — Patient Instructions (Addendum)
Call GI doctors to schedule liver specialist evaluation - let them know you want to go to [facility] and let them know you'd prefer after next week (for your son to go with you) Start zoloft at 50mg  daily - sent to pharmacy. Keep appointment next month.

## 2012-08-22 ENCOUNTER — Other Ambulatory Visit: Payer: Self-pay | Admitting: *Deleted

## 2012-08-22 ENCOUNTER — Ambulatory Visit: Payer: Medicare Other | Admitting: Family Medicine

## 2012-08-22 MED ORDER — OXYCODONE-ACETAMINOPHEN 5-325 MG PO TABS: 2.0000 | ORAL_TABLET | Freq: Three times a day (TID) | ORAL | Status: AC | PRN

## 2012-08-22 MED ORDER — ALPRAZOLAM 1 MG PO TABS: 1.0000 mg | ORAL_TABLET | Freq: Every evening | ORAL | Status: AC | PRN

## 2012-08-22 NOTE — Telephone Encounter (Signed)
Filled and gave to pt.

## 2012-08-22 NOTE — Telephone Encounter (Signed)
Patient called and asked for refill on meds. He knows it's early and says you can put the date they're due on the Rx's. He is on his way home from [facility] and wanted to pick them up on his way through to save him another trip back this way. He also advised the scan he had done showed liver cancer. The tumor is extremely large to the point surgery isn't an option right now. They are going to admit to him to [facility] and inject chemo directly into the tumor to try to shrink it and then go from there. If it doesn't shrink, there isn't any other option. If it does shrink, they may try surgery or other types of treatment. I asked that he have notes forwarded to you.

## 2012-08-23 ENCOUNTER — Telehealth: Payer: Self-pay | Admitting: *Deleted

## 2012-08-23 NOTE — Telephone Encounter (Signed)
Copy of Advanced Directives in your IN box along with contact info for Dr. Lavella Lemons at [facility]. Med list updated reflecting new meds from Dr. Lavella Lemons.

## 2012-08-25 ENCOUNTER — Encounter: Payer: Self-pay | Admitting: *Deleted

## 2012-08-25 NOTE — Telephone Encounter (Signed)
Reviewed, asked to scan.

## 2012-08-27 ENCOUNTER — Other Ambulatory Visit: Payer: Self-pay | Admitting: Family Medicine

## 2012-09-09 ENCOUNTER — Ambulatory Visit: Payer: Medicare Other | Admitting: Cardiovascular Disease

## 2012-10-04 ENCOUNTER — Ambulatory Visit: Payer: Self-pay | Admitting: Urology

## 2012-10-04 ENCOUNTER — Emergency Department: Payer: Self-pay | Admitting: Emergency Medicine

## 2012-10-22 ENCOUNTER — Encounter: Payer: Self-pay | Admitting: Family Medicine

## 2012-10-23 ENCOUNTER — Ambulatory Visit: Payer: Medicare Other | Admitting: Family Medicine

## 2012-12-27 ENCOUNTER — Other Ambulatory Visit: Payer: Self-pay | Admitting: Family Medicine

## 2013-02-04 DEATH — deceased

## 2013-05-16 IMAGING — CT CT STONE STUDY
1 of 2 series · 15 of 32 positions shown, 19 images · non-contrast
Comparison: None

REASON FOR EXAM: right flank pain, right CVA tenderness
COMMENTS:

PROCEDURE:     CT  - CT ABDOMEN /PELVIS WO (STONE)  - July 15, 2012 [DATE]
RESULT:     Indication: Flank Pain
TECHNIQUE: Multiple axial images from the lung bases to the symphysis pubis
were obtained without oral and without intravenous contrast.

[Series 2: 3mm soft tissue · axial · 0.97mm/px · z∈[-724,-259]mm · 15 of 171 slices shown, 19 images]
[im 8/171  soft-tissue]
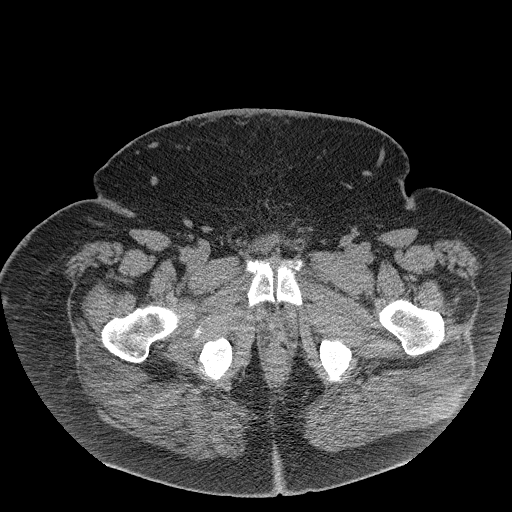
[im 8/171  bone]
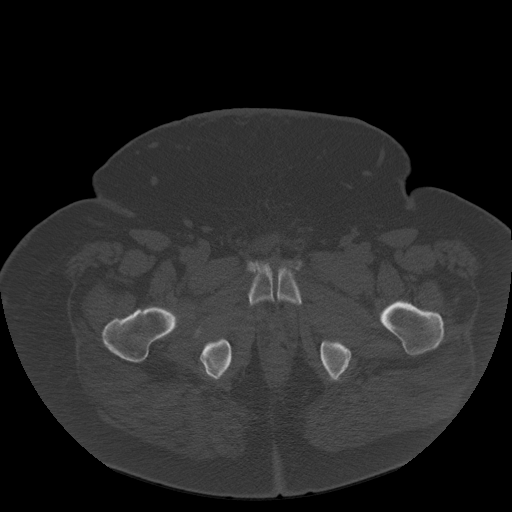
[im 23/171  soft-tissue]
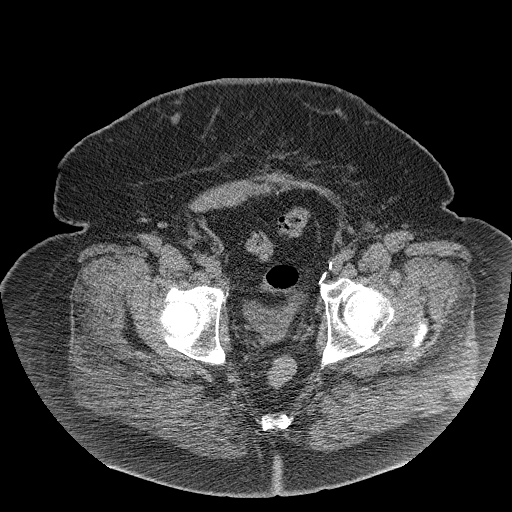
[im 37/171  soft-tissue]
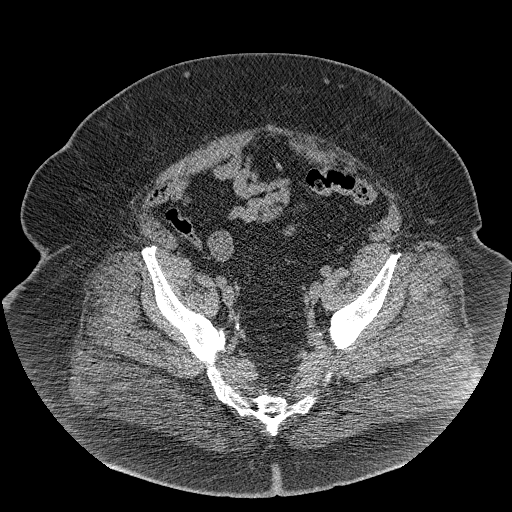
[im 45/171  soft-tissue]
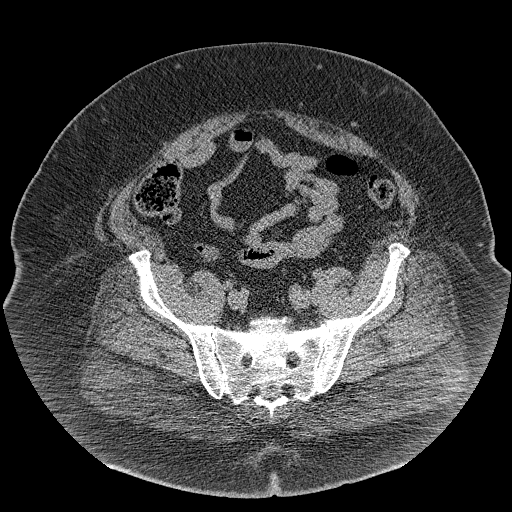
[im 60/171  soft-tissue]
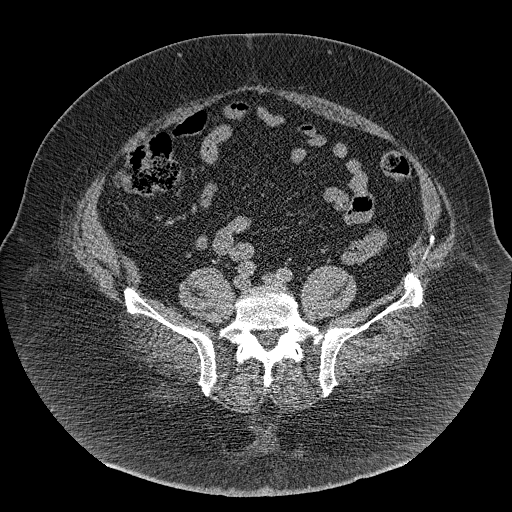
[im 74/171  soft-tissue]
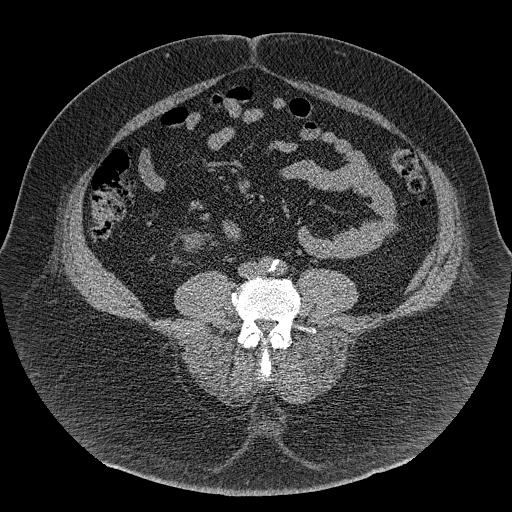
[im 89/171  soft-tissue]
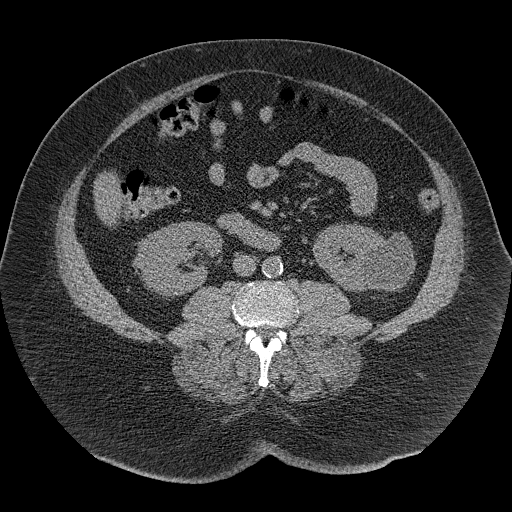
[im 97/171  soft-tissue]
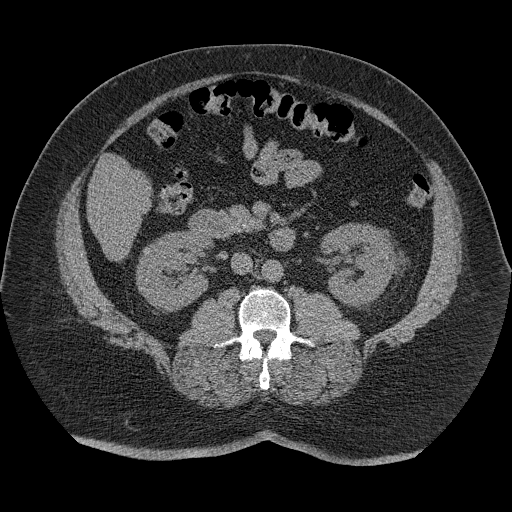
[im 111/171  soft-tissue]
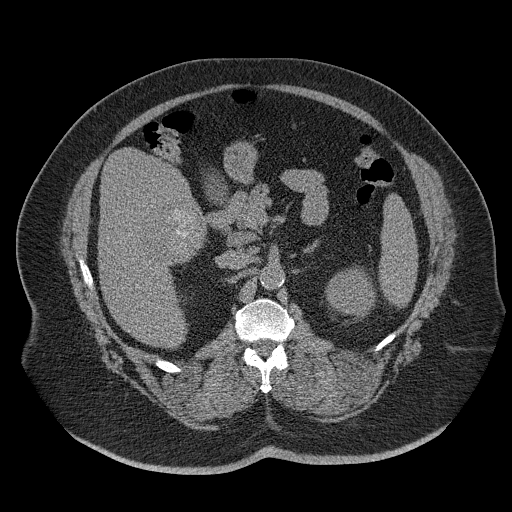
[im 111/171  bone]
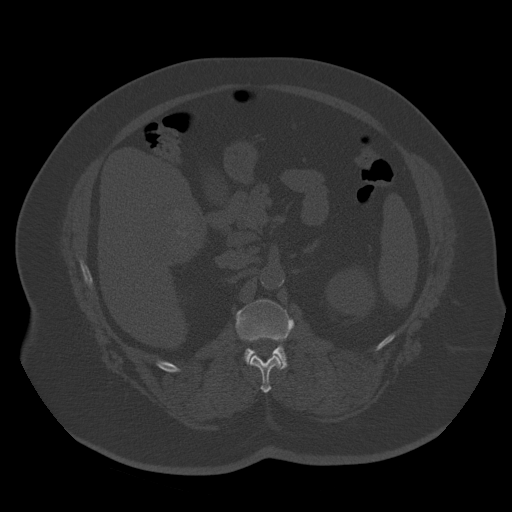
[im 126/171  soft-tissue]
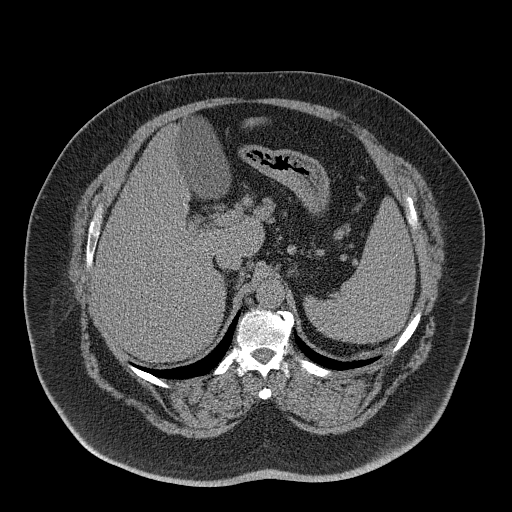
[im 134/171  soft-tissue]
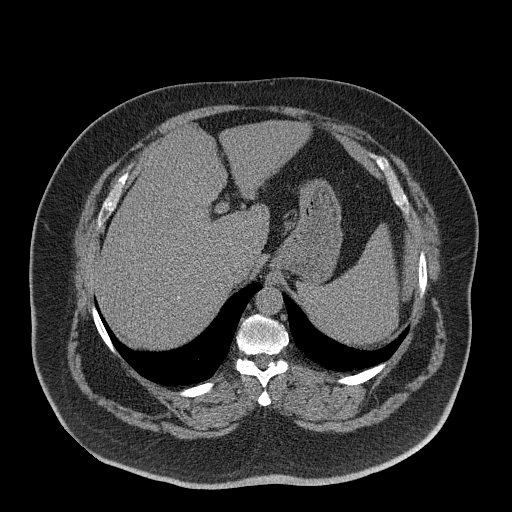
[im 141/171  lung]
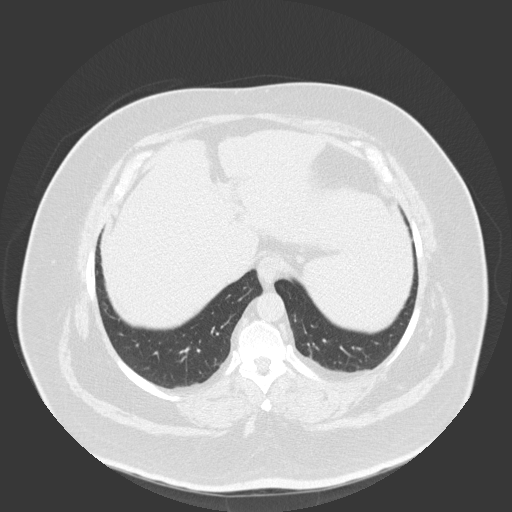
[im 148/171  soft-tissue]
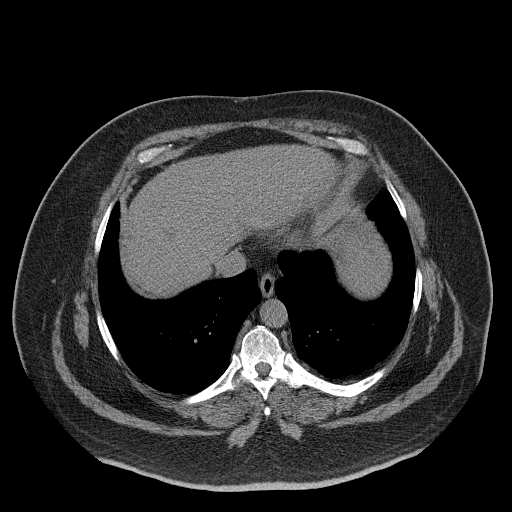
[im 148/171  lung]
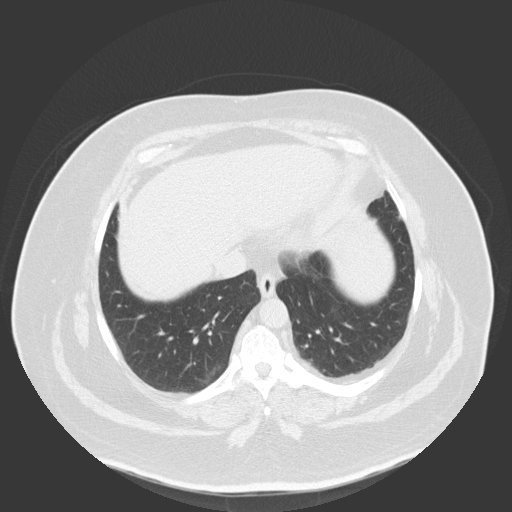
[im 156/171  lung]
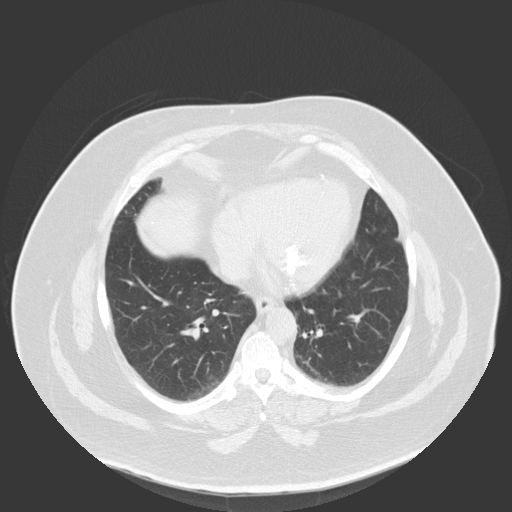
[im 163/171  soft-tissue]
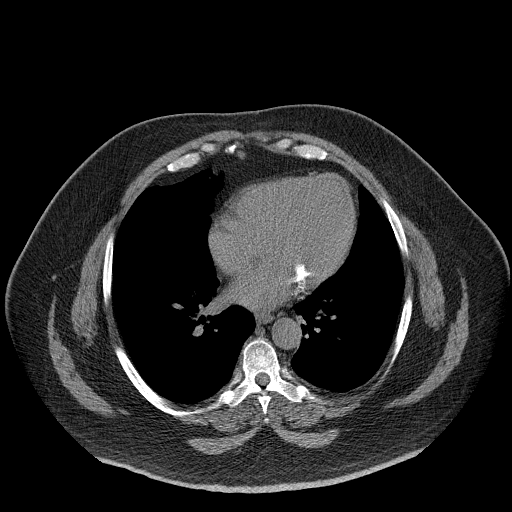
[im 163/171  lung]
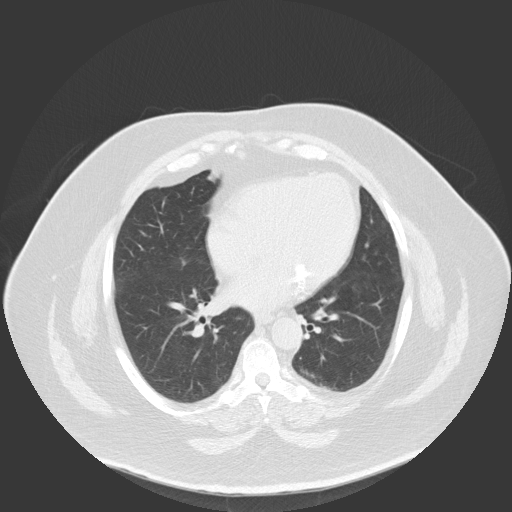

[15 of 32 positions shown; findings below may reference images not displayed]

FINDINGS: The lung bases are clear. There is no pleural or pericardial effusions.
There is coronary artery atherosclerosis.

No renal, ureteral, or bladder calculi. No obstructive uropathy. No
perinephric stranding is seen. There is a 5.8 cm left renal mass with thin
mural calcifications consistent with a mildly complicated left renal cyst.
The cyst is most compatible with a Bosniak II cyst. The bladder is
unremarkable.

There is a 7.4 cm right hepatic lobe mass with areas of scattered
hyperdensity within the mass. The gallbladder is unremarkable. The spleen
demonstrates no focal abnormality. The adrenal glands and pancreas are
normal.

There is a small fat-containing umbilical hernia. The unopacified stomach,
duodenum, small intestine, and large intestine are unremarkable, but
evaluation is limited by lack of oral contrast.  There is no
pneumoperitoneum, pneumatosis, or portal venous gas. There is no abdominal
or pelvic free fluid. There is no lymphadenopathy.

The abdominal aorta is normal in caliber with atherosclerosis.

The osseous structures are unremarkable.
IMPRESSION: 1. No urolithiasis or obstructive uropathy.

2. There is a 7.4 cm right hepatic lobe mass with areas of scattered
hyperdensity within the mass. Differential considerations include
hepatocellular carcinoma, hemangioma. Recommend further evaluation with a
multiphasic CT or MRI of the abdomen.

3. Stable left renal cyst unchanged compared with 08/23/2010.

[REDACTED]

## 2015-02-23 NOTE — Consult Note (Signed)
Brief Consult Note: Diagnosis: Back and RUQ pain. Hepatic mass.   Patient was seen by consultant.   Comments: Back pain with radiation to RUQ area. CT scan with 8 cm mass in liver. History of colonic polyps. Colonoscopy 08/2010 History of gastric polyps.   Recommendations: Agree with MRI of liver. Will add lumbar soine x-rays. CEA and AFP. Further recommendations to follow.  Electronic Signatures: Jill Side (MD)  (Signed 10-Sep-13 17:52)  Authored: Brief Consult Note   Last Updated: 10-Sep-13 17:52 by Jill Side (MD)

## 2015-02-23 NOTE — H&P (Signed)
PATIENT NAME:  [name], [name] MR#:  [id number] DATE OF BIRTH:  [birth year]  DATE OF ADMISSION:  07/16/2012  PRIMARY CARE PHYSICIAN: [facility]. CARDIOLOGIST: Dr. Rockey Situ. GASTROENTEROLOGIST: Dr. Candace Cruise.   CHIEF COMPLAINT: Right upper quadrant abdominal pain x2 days. The patient also reported having black stool.   HISTORY OF PRESENT ILLNESS: [name] is a 69 year old Caucasian male with a history of colon polyps by colonoscopy done about a year ago and history of diabetes mellitus, morbid obesity and coronary artery disease. He was referred to the hospital for evaluation of what was thought that he has melena when the patient complained about black stool, however, he had rectal examination at the Emergency Department and stool was guaiac-negative and the color was brown. Nevertheless, he was complaining of severe right upper quadrant abdominal pain that started two days ago on Sunday. The pain was described as sharp pain with severity of 8 on a scale of 10. He had some nausea but no vomiting. He reported low-grade fever at the beginning, two days ago, but that had subsided. The patient indicates that he has decrease in the appetite and he had lost some weight. In March his weight was 409. Now his weight is down to 378 as he measured it. The patient had work-up here at the Emergency Department with CT scan of the abdomen and that revealed 8 cm right liver mass. Since the pain was intractable, the patient was admitted for pain control and evaluation of his new finding of right-sided liver mass.   REVIEW OF SYSTEMS: CONSTITUTIONAL: Denies any fever, although he had self-limited low-grade fever two days ago, but that subsided. No chills. No fatigue. EYES: No blurring of vision. No double vision. ENT: No hearing impairment. No sore throat. No dysphagia. CARDIOVASCULAR: No chest pain. No shortness of breath. No syncope. RESPIRATORY: No shortness of breath. No hemoptysis. No cough. No chest pain.  GASTROINTESTINAL: He has pain at the right flank area and right upper quadrant posteriorly and radiating across the right side of the right upper quadrant. He has nausea, but no vomiting. No hematemesis. The patient reported black stool, but examination revealed brown stool that was heme negative. GENITOURINARY: No dysuria. No frequency of urination. MUSCULOSKELETAL: No joint pain or swelling. No muscular pain or swelling. INTEGUMENTARY: No skin rash. No ulcers. NEUROLOGY: No focal weakness. No seizure activity. No headache. PSYCHIATRY: No anxiety or depression. ENDOCRINE: No polyuria or polydipsia. No heat or cold intolerance.   PAST MEDICAL HISTORY:  1. Insulin-dependent diabetes mellitus. 2. Morbid obesity. 3. Hypertension.  4. Coronary artery disease, status post stent implant. 5. Suspected obstructive sleep apnea syndrome.  6. History of systolic heart murmur.  7. Hypothyroidism.  8. Peripheral neuropathy.  9. History of prostate cancer.   PAST SURGICAL HISTORY:  1. Cardiac stent implant. 2. Appendectomy.  3. Prostatectomy. 4. Tonsillectomy. 5. Left leg tibia repair.   SOCIAL HABITS: Ex-chronic smoker. He quit in 2000 and he has history of 40 years tobacco abuse. No history of alcohol or drug abuse.   SOCIAL HISTORY: He is divorced and lives at home alone.   FAMILY HISTORY: His father had diabetes and heart problems. His mother suffered from deep vein thrombosis and stroke.   ADMISSION MEDICATIONS:  1. Torsemide 20 mg twice a day and it appears there is duplication with furosemide 40 mg 2 tablets once a day.  2. Simvastatin 40 mg a day.  3. Nitroglycerin sublingual 0.4 mg p.r.n.  4. Metformin 1000 mg twice a  day. 5. Lisinopril 20 mg once a day. 6. Levothyroxine 112 mcg 2 tablets once a day. 7. Humulin R insulin using 50 units twice a day. 8. Humulin 70/30, taking 115 units twice a day although in March of this year his dose when he was discharged from the hospital was 50 units  twice a day.  9. Gabapentin 300 mg twice a day. 10. Aspirin 81 mg a day. 11. Alprazolam 1 mg at bedtime.   ALLERGIES: Codeine, trazodone and nefazodone.   PHYSICAL EXAMINATION:  VITAL SIGNS: Blood pressure 95/59, respiratory rate 16, pulse 87, temperature 97.6, oxygen saturation 93%.   GENERAL APPEARANCE: Elderly, morbidly obese male laying in bed in no acute distress.   HEAD AND NECK EXAMINATION: No pallor. No icterus. No cyanosis.   ENT: Hearing was normal. Nasal mucosa, lips, tongue were normal.   EYES: Normal eyelids and conjunctivae. Pupils are about 5 mm, equal and reactive to light.   NECK: Supple. Trachea at midline. No thyromegaly. No cervical masses.   HEART: Distant heart sounds. Faint S1 and S2. There is grade 1 to 2 systolic murmur at the apex and left sternal border. No carotid bruits.   RESPIRATORY: Normal breathing pattern without use of accessory muscles. No rales. No wheezing.   ABDOMEN: Soft. There is tenderness at the right upper quadrant area upon moderate palpation. No hepatosplenomegaly. No masses. No hernias. The abdomen is morbidly obese.   MUSCULOSKELETAL: No joint swelling. No clubbing.   NEUROLOGIC: Cranial nerves II through XII are intact. No focal motor deficit.   SKIN: No ulcers. No subcutaneous nodules.   PSYCHIATRIC: The patient is alert and oriented x3. Mood and affect were normal.   LABORATORY, DIAGNOSTIC, AND RADIOLOGICAL DATA: EKG showed normal sinus rhythm at rate of 79 per minute. Old anterior myocardial infarction. Otherwise unremarkable EKG. CT scan of the abdomen revealed an 8 cm exophytic right liver mass, appears mildly hypodense to the liver with scattered foci of hyperdensity. Differential diagnosis will include hepatocellular carcinoma or hemangioma. Mild splenomegaly. Left renal complex cyst about 5.4 cm. Serum glucose 161. His BUN 19, creatinine 1.2, sodium 137, potassium 4.4, lipase 200. Normal liver function tests. CBC showed white  count 10,000, hemoglobin 14, hematocrit 41, platelet count 223. Prothrombin time 13. INR 1. Urinalysis was unremarkable.   ASSESSMENT:  1. Right upper quadrant abdominal pain x2 days.  2. Right liver mass 8 cm suspicious for either hepatocellular carcinoma or hemangioma.  3. Insulin-dependent diabetes mellitus.  4. Coronary artery disease.  5. Morbid obesity. 6. Obstructive sleep apnea syndrome suspected.  7. Hypothyroidism.  8. History of prostatic cancer.  9. Colon polyps. 10. Systemic hypertension.   PLAN:  1. Will admit to the medical floor.  2. Pain control.  3. Gastroenterology consultation with Dr. Candace Cruise since he is familiar with him.  4. Obtain MRI of the liver for further evaluation of the mass.  5. Keep the patient n.p.o. except for his essential medications.  6. I will hold his insulin and place him on Accu-Cheks and sliding scale.  7. Gentle IV hydration.  8. Hold lisinopril since his blood pressure is borderline low.  9. The patient indicates that he has a LIVING WILL. His CODE STATUS is FULL.   TIME SPENT EVALUATING THIS PATIENT: More than 55 minutes.  ____________________________ Clovis Pu. Lenore Manner, MD amd:ap D: 07/16/2012 02:57:47 ET T: 07/16/2012 08:30:23 ET JOB#: [id number] cc: Clovis Pu. Lenore Manner, MD, <Dictator> [place] at [place] MD ELECTRONICALLY SIGNED 07/16/2012  22:19 

## 2015-02-23 NOTE — Discharge Summary (Signed)
PATIENT NAME:  [name], [name] MR#:  [id number] DATE OF BIRTH:  10-17-1946  DATE OF ADMISSION:  07/16/2012 DATE OF DISCHARGE:  07/17/2012   PRIMARY CARE PHYSICIAN: [facility]   PRESENTING COMPLAINT: Right abdomen and flank pain.   DISCHARGE DIAGNOSES:  1. Acute on chronic right flank back pain, appears musculoskeletal.  2. Right liver mass incidentally noted on CT abdomen, work-up by GI as outpatient.  3. Morbid obesity.  4. Type II diabetes.   MEDICATIONS AT DISCHARGE:  1. Xanax 1 mg 1 tablet at bedtime as needed for anxiety.  2. Aspirin 81 mg daily.  3. Torsemide 20 mg b.i.d.  4. Lasix 40 mg two tablets once a day and one tablet in the afternoon. 5. Gabapentin 300 mg one capsule b.i.d.  6. Humulin 70/30 115 units sub-Q b.i.d.  7. Humulin R 50 units b.i.d.  8. Humulin R 50 units once per day at noon.  9. Levothyroxine 112 mcg 2 tablets orally daily.  10. Lisinopril 20 mg daily.  11. Metformin 1000 mg b.i.d.  12. Simvastatin 40 mg at bedtime.  13. Nitroglycerin as needed.  14. Percocet 5/325 1 to 2 p.o. t.i.d. p.r.n.  15. Flexeril 10 mg t.i.d. p.r.n.   DIET: ADA 1800 calorie diet.    CONSULTATION: GI consultation with Dr. Dionne Milo    LABORATORY, DIAGNOSTIC, AND RADIOLOGICAL DATA: AFP tumor marker 2.7. CEA is 2.7.   CT of the abdomen and pelvis showed 7.4 cm right hepatic lobe mass with areas of scattered hyperdensity within the mass. Gallbladder is within normal limits. The spleen demonstrates no focal abnormality. Stable left renal cyst.   CBC and comprehensive metabolic panel within normal limits. Lipase 200.   BRIEF SUMMARY OF HOSPITAL COURSE: Mr. Gitlin is a 69 year old morbidly obese gentleman who weighs around 375 pounds with history of diabetes and chronic right-sided flank and back pain who came in with:  1. Right upper quadrant abdominal and back pain for one week. The patient has reported pain for about a year on and off and aggravated for the past  week. Denies any exertional activity. The patient's pain appeared musculoskeletal. He was started on IV hydrocodone, however, it made him sick with nausea and vomiting, hence it was changed to Percocet, Flexeril, and p.r.n. Valium. The patient continued to have pain. Recommended pain management consult. The patient adamantly refused. It was recommended the patient continue his home meds for pain and follow-up with primary care as outpatient.  2. Incidental right liver mass suspicious for either hepatocellular carcinoma or hemangioma. MRI of the abdomen was not able to be done due to the patient's morbid obesity. He will get open MRI after he sees Dr. Candace Cruise as outpatient.  3. Type II diabetes. On insulin with poor dietary compliance. His home dosing of insulin was continued.  4. Morbid obesity. Weight reduction advised.  5. Hypothyroidism. On Synthroid.  6. Hypertension. On lisinopril.   Hospital stay otherwise remained stable.   CODE STATUS: The patient remained a FULL CODE.   TIME SPENT: 40 minutes.  ____________________________ Hart [place] Posey Pronto, MD sap:drc D: 07/18/2012 13:42:06 ET T: 07/18/2012 15:44:07 ET JOB#: [id number] cc: Obie Kallenbach A. Posey Pronto, MD, <Dictator>, Jill Side, MD, [place] at [place] MD ELECTRONICALLY SIGNED 07/22/2012 22:14

## 2015-02-23 NOTE — Consult Note (Signed)
Brief Consult Note: Diagnosis: Acute Urinary Retention.   Patient was seen by consultant.   Consult note dictated.   Recommend further assessment or treatment.   Discussed with Attending MD.   Comments: 36fr foley with balloon stuck in penile urethra, balloon popped with 25g needle, foley removed, replaced with 51fr foley, >1200cc uop, foley to remain x 7 days at least, some bleeding from penile urethra, loose tourniquet applied.  nursing to remove no longer than 1 hour (1930).  Electronic Signatures: Felicity Coyer (MD)  (Signed [phone] 18:52)  Authored: Brief Consult Note   Last Updated: 29-Nov-13 18:52 by Felicity Coyer (MD)

## 2015-02-23 NOTE — Consult Note (Signed)
PATIENT NAME:  [name], [name] MR#:  [id number] DATE OF BIRTH:  [birth year]  DATE OF CONSULTATION:  07/17/2012  REFERRING PHYSICIAN:   CONSULTING PHYSICIAN:  Jill Side, MD  REASON FOR CONSULTATION: Right upper quadrant pain, abnormal CT scan.   HISTORY OF PRESENT ILLNESS: The patient is a 69 year old male, very poor historian. Apparently he was admitted with 2 to 3 day history of back pain that radiates to the right upper quadrant area. The patient points towards the mid thoracic spine as the site of the origin of the pain which then radiates along the right side and ends somewhere in the right upper quadrant area. CT scan of the abdomen was done which showed an 8 cm mass in the right lobe of the liver. The exact nature of that mass is not clear. The patient also reports multiple other symptoms. According to him, he had some vomiting a couple of days ago. He reported melena, although the rectal examination showed brown stools that were heme-negative. The pain is mostly constant although sometimes gets worse while he is sitting up. There is no relation to meals. According to the patient, the main reason he came to the hospital is because he wants a colonoscopy. Apparently he had a colonoscopy done about two years ago and polyps were removed. One of the polyps was large and flat and apparently a repeat colonoscopy was suggested in a year, but the patient did not show up for that colonoscopy last year.   PAST MEDICAL HISTORY:  1. Insulin-dependent diabetes. 2. Morbid obesity. 3. Hypertension. 4. Coronary artery disease. 5. Obstructive sleep apnea.  6. Hypothyroidism.  7. Peripheral neuropathy.  8. History of prostate cancer.   PAST SURGICAL HISTORY:  1. Cardiac stent implant.  2. Appendectomy.  3. Prostatectomy. 4. Tonsillectomy. 5. Colonoscopy in 2011, as mentioned above.   HOME MEDICATIONS:  1. Torsemide. 2. Simvastatin. 3. Metformin. 4. Lisinopril. 5. Levothyroxine. 6. Humulin R  50 units twice a day. 7. Humulin 70/30, 50 units twice a day. 8. Gabapentin. 9. Aspirin 81 mg a day. 10. Xanax 1 mg a day.   ALLERGIES: Codeine, trazodone, and nefazodone.  REVIEW OF SYSTEMS: Grossly negative except for what is mentioned in the History of Present Illness.   PHYSICAL EXAMINATION:   GENERAL: Morbidly obese male, does not appear to be in any acute distress.   VITAL SIGNS: Temperature 97.7, pulse 87, respirations 20, and blood pressure 137/80. Clinically he does not appear to be anemic or jaundiced.   LUNGS: Grossly clear to auscultation bilaterally with fair air entry and no added sounds.   CARDIOVASCULAR: Regular rate and rhythm, 2/6 systolic murmur.   ABDOMEN: Examination did not reveal any significant right upper quadrant tenderness. Abdominal examination is difficult because of his morbid obesity. No significant abnormalities were found.   NEUROLOGIC: Appears to be unremarkable.   LABORATORY, DIAGNOSTIC AND RADIOLOGIC DATA: White cell count 10.2, hemoglobin 14, hematocrit 41.8, and platelet count 223. Serum lipase 200. Liver enzymes are normal. BUN 19. PT and INR are within normal limits.   Urinalysis is unremarkable.   CT scan of the abdomen and pelvis without contrast showed a 7.4 cm right hepatic lobe mass with areas of scattered hyperdensity within the mass. Different considerations include hepatocellular carcinoma and hemangioma according to the radiologist.   ASSESSMENT AND PLAN: The patient is with right-sided back pain with radiation to the right upper quadrant area. This is probably musculoskeletal. CT scan showed a 7.4 cm mass in the right lobe  of the liver which may or may not be responsible for this discomfort. The patient also reports melena although his hemoglobin and hematocrit are fine, no melena has been noted in the hospital and his stools are brown and heme negative. Some nausea and vomiting was reported by the patient as well although again no such  symptoms have been reported by the nursing staff in the hospital. The patient has history of colon polyps and was recommended to have a colonoscopy in 2012. He did not show up and now is quite insistent on having a colonoscopy while he is in the hospital for abdominal pain. I agree with current management plan of obtaining a MRI for further evaluation of hepatic mass. I have ordered alpha-fetoprotein as well as CEA. Further recommendations depending on the results of the above-mentioned tests. Colonoscopy can be performed as an outpatient. We will follow and further recommendations to follow.  ____________________________ Jill Side, MD si:slb D: 07/17/2012 10:45:28 ET T: 07/17/2012 11:09:18 ET JOB#: [id number]  cc: Jill Side, MD, <Dictator> Jill Side MD ELECTRONICALLY SIGNED 07/29/2012 12:35

## 2015-02-23 NOTE — Consult Note (Signed)
PATIENT NAME:  [name] MR#:  [id number] DATE OF BIRTH:  1946/06/05  DATE OF CONSULTATION:  10/04/2012  REFERRING PHYSICIAN:  ER physician, Dr. Ulice [name]  CONSULTING PHYSICIAN:  [name] Mule. Valma Cava II, MD  REASON FOR CONSULTATION: Acute urinary retention and nonfunctioning Foley.   HISTORY OF PRESENT ILLNESS: The patient is a 69 year old male with a history of liver carcinoma, diabetes mellitus, morbid obesity, and coronary artery disease who is managed with a Foley as an outpatient due to his liver cancer and urinary incontinence. He subsequently had a Foley catheter replaced at nursing facility and was in severe pain after catheter was placed. This was several hours ago. He presents to the ER. A bladder scan shows greater than 900 currently. His Foley catheter is halfway hanging out of his penis and despite aspirating off fluid could not be removed from the penis. CT scan from the ER shows a Foley balloon elongated and dilated within the penile urethra. The patient has severe pain currently. Denies any nausea, vomiting, or fever currently.   REVIEW OF SYSTEMS: Balance of 12 point review of systems is negative other than outlined above.   PAST MEDICAL HISTORY: 1. Liver carcinoma. 2. Diabetes mellitus. 3. Morbid obesity. 4. Hypertension. 5. Coronary artery disease. 6. Heart murmur. 7. Hypothyroidism. 8. Peripheral neuropathy. 9. History of prostate cancer.   PAST SURGICAL HISTORY:  1. Cardiac stent. 2. Appendectomy. 3. Prostatectomy. 4. Tonsillectomy. 5. Left tibia repair.   SOCIAL HISTORY: Chronic smoker, quit in 2000.   FAMILY HISTORY: Noncontributory.   OUTPATIENT MEDICATIONS: Up-to-date, reviewed in detail and correct in the Emergency Room record.   ALLERGIES: Codeine, trazodone, nefazodone.   PHYSICAL EXAMINATION:   VITAL SIGNS: Afebrile. He is normotensive, non-tachycardic. Respiratory rate is 18 to 20.   GENERAL: Morbidly obese male, appears uncomfortable pacing  around the room, Foley catheter hanging out of his penis, incorrect amount of length hanging out, blood in the bag.   HEENT: Normocephalic, atraumatic, nonicteric.   CHEST: Clear, nonlabored.   CARDIOVASCULAR: Regular rate and rhythm.   ABDOMEN: Obese, soft. He is tender suprapubically.   GU: Normal penis and bilateral descended testicles. Foley catheter again hanging out. Balloon is in the penile urethra, the junction of the penoscrotal junction.   RECTAL: Deferred.   EXTREMITIES: Obese. Some edema.  NEUROLOGIC: Grossly intact.   MUSCULOSKELETAL: 5 out of 5 strength.   PSYCH: Appropriate.   ASSESSMENT: This is a 69 year old male status post prostatectomy-U, leakage of urine, remains with a Foley catheter with liver cancer and has a Foley catheter balloon blown up in his penile urethra and could not be deflated. This is CT proven. He has acute urinary retention behind this.   After the patient provided verbal consent, I briefly prepped the penoscrotal junction with some Betadine and using a 25-gauge needle through the side of the urethra to pop the balloon and the catheter immediately fell out. The balloon was inspected and found to be all parts intact and accounted for. I then immediately placed a 16 French Foley catheter and 10 mL of sterile water in the balloon in the correct position and he expressed over 1200 mL of urine output. There was some penile urethral bleeding secondary to balloon being blown up in it. Around the catheter I tied a quick loose tourniquet of gauze around the penile urethra. There was excellent capillary refill of the glans. This will remain on for one hour and then be removed. The patient is reassured regarding penile urethral bleeding  will stop but it is difficult to provide compression on other than with the loose tourniquet.   RECOMMENDATIONS ARE AS FOLLOWS:  1. Continue Foley catheter for at least seven days.  2. Free access to fluids tonight. 3. Routine  follow-up with his primary urologist.  4. Penile loose tourniquet to be removed prior to discharge from the ED.   ____________________________ [name] Mule. Nobie Putnam, MD erh:drc D: 10/04/2012 19:11:07 ET T: 10/05/2012 09:39:14 ET JOB#: [id number]  cc: [name] Mule. Nobie Putnam, MD, <Dictator> [name] Sage MD ELECTRONICALLY SIGNED 10/05/2012 12:33

## 2015-02-28 NOTE — H&P (Signed)
PATIENT NAME:  [name], [name] MR#:  [id number] DATE OF BIRTH:  Mar 05, 1946  DATE OF ADMISSION:  01/10/2012  REFERRING PHYSICIAN: Liana Gerold, MD  PRIMARY CARE PHYSICIAN: Teresa Pelton, MD  CARDIOLOGIST: Ida Rogue, MD (The patient has yet to see)  CHIEF COMPLAINT: Chest pain.   HISTORY OF PRESENT ILLNESS: The patient is a morbidly obese Caucasian male with history of congestive heart failure per patient, coronary artery disease status post stenting, hypothyroidism, and diabetes who presents with chest pain. The patient stated the chest pain started last night acutely. He had an episode at about 2:00 a.m. and had a nitroglycerin about 3:00 a.m. and another one about 8:30 this morning. He feels chest pressure, as if "an elephant were sitting on his chest". He had been having some shortness of breath for 3 to 4 days prior to that. The pain starts on the right upper extremity and shoots more medially towards the right chest and then goes to the left chest. He feels some pain on inspiration as well. There is some pain on palpation as well. He denies having any abdominal pain. He has had some blurry vision and some shortness of breath associated with this pain. He also vomited once or twice without blood, however, he does not know the nature it. Here he has had a negative troponin and an EKG similar to previous EKGs without acute ST elevations or depressions. Hospitalist service was contacted for further evaluation and management.   PAST MEDICAL HISTORY:  1. Morbid obesity. 2. Insulin-dependent diabetes mellitus. 3. Hypertension. 4. Coronary artery disease status post stenting. 5. Hypothyroidism. 6. Depression. 7. Neuropathy.  8. History of prostate cancer.    PAST SURGICAL HISTORY:  1. Tonsillectomy. 2. Appendectomy. 3. Left leg fibula repair. 4. Prostatectomy.   ALLERGIES: Codeine, nefazodone, trazodone.   MEDICATIONS:  1. Percocet 5/325 mg two tablets every 8 hours as needed for  pain.  2. Alprazolam 1 mg at bedtime as needed.  3. Aspirin 325 mg daily. 4. Vitamin D 1000 international units daily.  5. Lasix 40 mg two tablets in the morning. 6. Gabapentin 300 mg two caps three times daily. 7. Insulin 70/30, 50 units twice daily.  8. Sliding scale insulin.  9. Levothyroxine 225 mcg daily.  10. Lisinopril 20 mg daily.  11. Meloxicam 15 mg as needed for pain. 12. Metformin 1000 mg twice a day with meals, 500 mg at lunchtime. 13. Metoprolol 25 mg twice a day. 14. Nitroglycerin sublingual as needed for pain.  15. Simvastatin 40 mg daily.  FAMILY HISTORY: Mom with DVTs as well as cerebrovascular accident. Dad with heart problems and diabetes.   SOCIAL HISTORY: Quit tobacco 40+ years ago. No alcohol or drug use. Lives by himself.   REVIEW OF SYSTEMS: CONSTITUTIONAL: No fever or fatigue. No weakness. No weight changes. EYES: Some blurry vision, acute and chronic. No glaucoma. ENT: No tinnitus, hearing loss, ear pain, sinus discharge, or snoring. RESPIRATORY: No cough. No wheezing. Some shortness of breath. Some painful respiration. CARDIOVASCULAR: Chest pain as above. Two-pillow orthopnea. Chronic lower extremity edema. No arrhythmia. Has chronic dyspnea on exertion. GASTROINTESTINAL: Some vomiting. No diarrhea, abdominal pain, hematemesis or rectal bleeding. GENITOURINARY: Denies dysuria, hematuria, or incontinence. ENDOCRINE: Denies polyuria, nocturia, or thyroid problems. HEME/LYMPH: Denies anemia or easy bruising. SKIN: Denies any rashes. MUSCULOSKELETAL: Chronic arthritis. NEURO: No weakness. Has some numbness chronically. No dementia or ataxia. NEUROLOGIC: Has anxiety and history of depression.   PHYSICAL EXAMINATION:   VITAL SIGNS: Temperature on arrival 97.2,  pulse 77, respiratory rate 28, blood pressure 134/69, and oxygen saturation 88% on room air.   GENERAL: The patient is a morbidly obese Caucasian male lying in bed, in no obvious distress, talking in full  sentences.   HEENT: Normocephalic, atraumatic. Pupils are equally round and reactive to light. Sclerae clear. Moist mucous membranes.   NECK: Supple. No thyroid tenderness. No lymphadenopathy.   CARDIOVASCULAR: S1, S2 regular rate and rhythm. No murmurs, rubs, or gallops appreciated. On palpation of front of chest there is some generalized tenderness to palpation of the right greater than the left chest anteriorly. No evidence of cellulitis, edema, or erythema.  LUNGS: Clear to auscultation without wheezing, rhonchi, or crackles.   ABDOMEN: Soft, morbidly obese, nontender, nondistended. Unable to appreciate organomegaly.   EXTREMITIES: 2+ edema chronically with some chronic skin changes.   NEUROLOGIC: Cranial nerves II through XII grossly intact. Strength 5/5 in all extremities.   LABS/STUDIES: BNP 243. Sodium 139, potassium 4.5, glucose 231, creatinine 1.1, BUN 68. LFTs: AST is 42, otherwise within normal limits. Troponin negative. CK-MB 3.5. Hemoglobin 12.3, hematocrit 40.1. WBC 6.3, platelets 215. INR 1.   EKG: Sinus with first degree AV block, Q waves in V1 to V3. No acute ST elevations or depressions. Consistent with prior EKG.  X-ray of the chest: PVC possibly with mild interstitial edema, mild cardiomegaly.   ASSESSMENT AND PLAN: We have a 69 year old Caucasian male with multiple comorbidities including morbid obesity, hypertension, hyperlipidemia, insulin-dependent diabetes mellitus, and coronary artery disease status post stenting in the past who presents with acute onset chest pain which has typical and atypical features. The patient has some pleuritic chest pain in nature as well as there is tenderness to palpation of the anterior chest. Nonetheless, he has history of coronary artery disease and stenting and multiple risk factors including obesity, diabetes, hyperlipidemia, and hypertension to develop further cardiovascular disease. At this point, we will admit the patient for  observation and rule out myocardial infarction. First troponin is negative, and we would cycle them. Get an echo and lipid profile. Would start the patient on aspirin, nitro paste, beta blocker, and ACE inhibitor. We will also start the patient on p.r.n. morphine. This could be some costochondritis as well and will try a trial of ibuprofen given the nature of tenderness to palpation. I would also check a d-dimer as well. The patient has pleuritic type of chest pain. There is no fever or leukocytosis to suggest a pneumonia. Would also order a stress test given the above risk factors. For his diabetes, we would resume outpatient medications and check a Hemoglobin A1c. He states he has congestive heart failure and he states his heart function is 17%. There is no echo here and the patient has not followed up with his cardiologist, Dr. Rockey Situ. He does not appear to be in grossly volume overload state, although he appears to have some chronic lower extremity edema. His BNP is not significantly elevated. I would continue his Lasix and again check an echocardiogram. We will check a TSH and resume his Synthroid and start him on heparin for deep vein thrombosis prophylaxis and PPI as well. The patient is FULL CODE.  TOTAL TIME SPENT: 55 minutes.  ____________________________ Vivien Presto, MD sa:slb D: 01/10/2012 12:14:37 ET T: 01/10/2012 12:29:37 ET JOB#: [id number]  cc: Vivien Presto, MD, <Dictator> Minna Merritts, MD Dr. Cliffton Asters MD ELECTRONICALLY SIGNED 01/11/2012 15:39

## 2015-02-28 NOTE — Discharge Summary (Signed)
PATIENT NAME:  [name], [name] MR#:  [id number] DATE OF BIRTH:  1946-05-07  DATE OF ADMISSION:  01/12/2012 DATE OF DISCHARGE:  01/15/2012  PRIMARY CARE PHYSICIAN: Modesto Charon, MD  CONSULTATION: Cardiology consult with Ida Rogue, MD   DISCHARGE DIAGNOSES:  1. Musculoskeletal chest pain.  2. Morbid obesity.  3. Diabetes mellitus.  4. Mild systolic heart failure.  5. Hypothyroidism.  6. Coronary artery disease.  7. Possible sleep apnea.   DISCHARGE MEDICATIONS:  1. Xanax 1 mg every day as needed for sleep at bedtime.  2. Aspirin 325 mg p.o. daily.  3. Lasix 40 mg, 2 tablets once a day.  4. Gabapentin 300 mg, 2 capsules,    p.o. t.i.d.  5. Insulin regular insulin according to sliding scale. The patient knows about the sliding scale.  6. Insulin 70/30, 50 units p.o. b.i.d. with meals.  7. Levothyroxine 225 mcg daily.  8. Lisinopril 20 mg p.o. daily.  9. Metformin 1 gram p.o. b.i.d.  10. Metoprolol  25 mg every 12 hours.  11. Simvastatin 40 mg daily   HOME OXYGEN: The patient needs home oxygen. We are trying to determine how much he needs.   FOLLOWUP:  Follow up with Dr. Council Mechanic in 1 to 2 weeks.    LABORATORY, DIAGNOSTIC AND RADIOLOGICAL DATA:  TSH 1.56 on admission. D-dimer is 0.41. BNP 243.0. Troponins have been negative, less than 0.02.  WBC on admission 6.3, hemoglobin 12.3, hematocrit 40.1, platelets 215.  Electrolytes on admission: Sodium 139, potassium 4.5, chloride 102, bicarbonate 27, BUN 16, creatinine 1.11, glucose 231.  EKG showed sinus rhythm with first-degree AV block, 73 beats per minute.  Chest x-ray: Pulmonary vascular congestion with mild pulmonary interstitial edema. Echocardiogram showed ejection fraction 50 to 55%. Systolic function is low normal. The patient had septal mid to distal anterior apical wall motion abnormality.  LDL is 116, VLD 23, and triglycerides 117.0. Hemoglobin A1c 10.1. Kidney function stayed stable.  Two-day stress test  showed perfusion defect consistent with old scar, ejection fraction 45%, mildly dilated left ventricle. No significant EKG changes, overall low-risk scan.  Urine culture showed mixed organisms.  BNP on March 8th 151.0. V/Q scan on March 10th showed low probability for pulmonary emboli. The study was incomplete because of large body habitus.   DISCHARGE VITAL SIGNS: This morning, temperature 98, pulse is 73, respirations 18, blood pressure 131/48, saturations 94% on 2 liters.   HOSPITAL COURSE: The patient is a 69 year old morbidly obese male with hypertension, diabetes, hyperlipidemia, hypothyroidism, coronary artery disease, came in because of right-sided chest pain. The patient felt as if an elephant was sitting on the chest with some trouble breathing, also had some orthopnea and PND. He was admitted to the Hospitalist Service for:  1. Chest pain, rule out myocardial infarction: He was also started on aspirin, beta blockers, along with ACE inhibitors. The patient's D-dimers were negative. The patient was seen by a cardiologist, Dr. Esmond Plants, and had an echo done which showed some wall defects as I mentioned before. So, he had a stress test which showed fixed defects, and Dr. Rockey Situ said that his stress test is not suggesting of any acute ischemia but old scars on the stress test. The patient was referred to have a V/Q scan, so a VQ scan was done. The V/Q scan did not show any pulmonary emboli. The  patient was started on IV Lasix for a few days to decrease the fluid. The patient feels better, however, is needing oxygen  to maintain saturations more than 95. We will have to check oxygen saturations at rest and exertion to see how much he needs. The patient was advised to have a sleep study. He said he had a sleep study a few years ago, and he was suggested CPAP, but he never used it; so the patient needs to have a sleep study again and use a CPAP because of his morbid obesity with snoring and stopping  breathing at night.  2. Diabetes mellitus, type 2: Hemoglobin A1c is elevated. The patient has noncompliance issues. He was advised to take the medicines regularly with insulin and also metformin.  3. High blood pressure: Right now his blood pressure is well controlled. He is on metoprolol along with Lasix, and he can continue that.  4. Hyperlipidemia: Lipids are controlled. Continue statins.  5. Hypothyroidism: He is on 225 mcg of Synthroid. TSH is within normal limits. Continue that. 6. The patient has some pain on palpation over the right chest, so he has been getting Motrin and he can continue that as needed.   CONDITION ON DISCHARGE: The patient's condition is stable.   TIME SPENT ON DISCHARGE PREPARATION: More than 30 minutes.   ____________________________ Epifanio Lesches, MD sk:cbb D: 01/15/2012 10:35:26 ET T: 01/15/2012 12:03:05 ET JOB#: [id number]  cc: Epifanio Lesches, MD, <Dictator> Modesto Charon, MD Epifanio Lesches MD ELECTRONICALLY SIGNED 01/18/2012 22:20

## 2015-02-28 NOTE — Discharge Summary (Signed)
PATIENT NAME:  [name], [name] MR#:  [id number] DATE OF BIRTH:  11-06-1946  DATE OF ADMISSION:  01/12/2012 DATE OF DISCHARGE:  01/17/2012  ORIGINAL DICTATION DONE ON 01/15/2012. THIS IS AN ADDENDUM.  Patient did not go on 01/15/2012. The discharge date is 01/17/2012. The only change is patient's Lasix is changed to Demodex because patient was having shortness of breath even with minimal exertion. Patient's discharge was canceled on 01/15/2012 and Dr. Rockey Situ recommended Lasix changed to Demodex and we started the [facility] yesterday. Patient is on Demodex 20 mg p.o. b.i.d. Patient will be going home with that and will discontinue the Lasix. Rest of the discharge medications and discharge summary remains the same. Patient did not need oxygen at home with oxygen saturations at rest and on exertion on room air are above 90%.   DISCHARGE MEDICATIONS: Discontinue the Lasix from discharge medications and add Demadex 20 mg p.o. b.i.d.   TIME SPENT ON DISCHARGE PREPARATION: Less than 30 minutes.   ____________________________ Epifanio Lesches, MD sk:cms D: 01/17/2012 11:32:06 ET T: 01/18/2012 10:36:29 ET JOB#: [id number]  cc: Epifanio Lesches, MD, <Dictator> Epifanio Lesches MD ELECTRONICALLY SIGNED 01/18/2012 22:20

## 2015-02-28 NOTE — Consult Note (Signed)
General Aspect 69 yo morbidly obese Caucasian male with coronary artery disease, MI "17 years ago",  status post stenting to the LAD, hypothyroidism, and diabetes (poorly controlled), who "does not take care of himself", presents with chest pain on Wednesday. Cardiology was called for chest pain.   chest pain started two nights ago and felt similar to his previous MI.  He had an episode at about 2:00 a.m. and had a nitroglycerin about 3:00 a.m. and another one about 8:30 in the AM. He felt like "an elephant were sitting on his chest". He had been having some shortness of breath for several weeks. His son noticed the breathing problem when the patient visited for the superbowl (one month ago). The chest pain started on the right upper extremity and moved medially towards the right chest and then  to the left chest.  Some pain on inspiration as well.  some pain on palpation as well. He denies having any abdominal pain.    Present Illness . FAMILY HISTORY: Mom with DVTs as well as cerebrovascular accident. Dad with heart problems and diabetes.   SOCIAL HISTORY: Quit tobacco 40+ years ago. No alcohol or drug use. Lives by himself.   Physical Exam:   GEN obese    HEENT red conjunctivae    NECK supple    RESP normal resp effort  clear BS    CARD Regular rate and rhythm  No murmur    ABD denies tenderness  soft    EXTR positive edema, Trace    SKIN normal to palpation    NEURO cranial nerves intact, motor/sensory function intact    PSYCH alert, A+O to time, place, person, good insight   Review of Systems:   Subjective/Chief Complaint chest pain, SOB    General: No Complaints    Skin: No Complaints    ENT: No Complaints    Eyes: No Complaints    Neck: No Complaints    Respiratory: Short of breath    Cardiovascular: Chest pain or discomfort  Dyspnea    Gastrointestinal: No Complaints    Genitourinary: No Complaints    Vascular: No Complaints    Musculoskeletal: No  Complaints    Neurologic: No Complaints    Hematologic: No Complaints    Endocrine: No Complaints    Psychiatric: No Complaints    Review of Systems: All other systems were reviewed and found to be negative    Medications/Allergies Reviewed Medications/Allergies reviewed     Cardiac Stent:    Diabetes Mellitus, Type II (NIDD):    Prostate surgery for Prostate CA:    appendectomy:        Admit Diagnosis:   CHEST PAIN: 11-Jan-2012, Active, CHEST PAIN      Admit Reason:   Chest pain: (786.50) 10-Jan-2012, Active, ICD9, Unspecified chest pain  Home Medications: Medication Instructions Status  ALPRAZolam 1 mg tablet 1 tab(s) orally once a day (at bedtime) as needed. **brand name xanax**  Active  aspirin 325 mg oral delayed release tablet 1 tab(s) orally once a day Active  furosemide 40 mg oral tablet 2 tab(s) orally once a day. **brand name lasix** Active  gabapentin 300 mg oral capsule 2 caps ($Remov'600mg'DqAlpm$ ) orally 3 times a day. **brand name neurontin** Active  insulin regular 100 units/mL human recombinant injectable solution  inject into the skin as directed. sliding scale insulin. **brand name novolin r** Active  insulin isophane-insulin regular 70 units-30 units/mL human recombinant subcutaneous suspension 50 unit(s) subcutaneous 2 times a day  with a meal. **brand name novolin 70/30** Active  levothyroxine 200 mcg (0.2 mg) oral tablet 1 tab(s) orally once a day (take with 90mcg tab for a total of 261mcg dose). **brand name synthroid** Active  levothyroxine 25 mcg (0.025 mg) oral tablet 1 tab(s) orally once a day (take along with 263mcg tab for a total of 211mcg dose). **brand name synthroid** Active  lisinopril 20 mg oral tablet 1 tab(s) orally once a day. **brand name prinivil, zestril** Active  meloxicam 15 mg oral tablet 1 tab(s) orally once a day as needed for pain.  **brand name mobic**  Active  metformin 1000 mg oral tablet 1 tab(s) orally 2 times a day with a meal, and 1/2  tab ($Rem'500mg'zTJX$ ) orally at lunchtime.  **brand name glucophage** Active  metoprolol tartrate 25 mg oral tablet 1 tab(s) orally 2 times a day. **brand name lopressor** Active  acetaminophen-oxycodone 325 mg-5 mg oral tablet 2 tab(s) orally every 8 hours as needed for pain.  **brand name percocet** Active  simvastatin 40 mg oral tablet 1 tab(s) orally once a day (at bedtime). **brand name zocor** Active  cholecalciferol 1000 intl units oral capsule 1 cap(s) orally once a day. **brand name vitamin d3** Active  nitroglycerin 0.4 mg sublingual tablet 1 tab(s) sublingual every 5 minutes x 3 doses asneeded for chest pain. **if no relief call md or go to emergency room** *brand name nitrostat** Active     Cardiac:  07-Mar-13 02:06    CK, Total 108   CPK-MB, Serum 2.6  Routine Chem:  07-Mar-13 02:06    Glucose, Serum 160   BUN 18   Creatinine (comp) 1.09   Sodium, Serum 143   Potassium, Serum 4.2   Chloride, Serum 101   CO2, Serum 29   Calcium (Total), Serum 9.0   Osmolality (calc) 290   eGFR (African American) >60   eGFR (Non-African American) >60   Anion Gap 13  Routine Hem:  07-Mar-13 02:06    WBC (CBC) 7.0   RBC (CBC) 4.43   Hemoglobin (CBC) 11.5   Hematocrit (CBC) 35.5   Platelet Count (CBC) 204   MCV 80   MCH 25.9   MCHC 32.3   RDW 16.5  Cardiac:  07-Mar-13 02:06    Troponin I 0.02  Routine Hem:  07-Mar-13 02:06    Neutrophil % 55.7   Lymphocyte % 28.8   Monocyte % 12.0   Eosinophil % 2.9   Basophil % 0.6   Neutrophil # 3.9   Lymphocyte # 2.0   Monocyte # 0.8   Eosinophil # 0.2   Basophil # 0.0  Routine Chem:  07-Mar-13 02:06    Magnesium, Serum 1.8   Hemoglobin A1c ([facility]) 10.1   Cholesterol, Serum 168   Triglycerides, Serum 117   HDL ([facility]) 29   VLDL Cholesterol Calculated 23   LDL Cholesterol Calculated 116  Blood Glucose:  07-Mar-13 07:55    POCT Blood Glucose 189  Cardiology:  07-Mar-13 08:46    Protocol LEXISCAN   Max Work Load 10   Total Exercise  Time 61   Max Diastolic BP 59   Max Heart Rate 74   Max Predicted Heart Rate 155  Blood Glucose:  07-Mar-13 12:05    POCT Blood Glucose 294    16:26    POCT Blood Glucose 266    20:35    POCT Blood Glucose 226   Radiology Results: XRay:    06-Mar-13 10:18, Chest Portable Single View   Chest Portable  Single View    REASON FOR EXAM:    pain  COMMENTS:       PROCEDURE: DXR - DXR PORTABLE CHEST SINGLE VIEW  - Jan 10 2012 10:18AM     RESULT: Comparison is made to the study of 22 August 2010.    Pulmonary vascular congestion is present very mild interstitial edema is   not excluded. The heart is borderline to mildly enlarged. No significant   effusion is seen.    IMPRESSION:  Pulmonary vascular congestion possibly with mild   interstitial edema. The exposure is somewhat lobe which can accentuate   lung markings. Mild cardiomegaly.      Verified By: Sundra Aland, M.D., MD    Codeine: Unknown  Trazodone: Unknown  nefazodone: Unknown  Vital Signs/Nurse's Notes: **Vital Signs.:   08-Mar-13 07:33   Temperature Temperature (F) 97.6   Celsius 36.4   Pulse Pulse 60   Respirations Respirations 20   Systolic BP Systolic BP 95   Diastolic BP (mmHg) Diastolic BP (mmHg) 60   Mean BP 71   Pulse Ox % Pulse Ox % 93   Oxygen Delivery 2L     Impression 69 yo morbidly obese Caucasian male with coronary artery disease, MI "17 years ago",  status post stenting to the LAD, hypothyroidism, and diabetes (poorly controlled), who "does not take care of himself", presents with chest pain on Wednesday.  A/P: 1) Chest pain Cardiac enz negative Old anterior MI on EKG ECHO suggesting mid to distal anterior wall MI, probably old. Now pain free. --Stress test was done over two days. Resultas pending.  If stress is negative, etiology of chest pain is uncertain. Differential includes pulmonary embolism (very sedentary, recent leg pain on the right), angina with small vessel disease with  no cardiac enz elevation though given the intensity of the pain, would expect cardiac enz elevation, or GI etiology (hiatal hernia).  2) Diabetes: Poorly controlled. Does not take care of himself, weight a major issue.  3) CAd, s/p PCI LAD PCI placed >10 years ago after anterior MI, at [facility]  4) Obesity: May benefit from Lap band or gastric bypass surgery  5) SOB: Long smoking hx, may have COPD unable to exclude PE (V/Q scan scheduled) possible obesity hypoventilation.  --Would monitor ambulatory oxygen sats.  May need inhalers and oxygen if he drops.  He appears very symptomatic over past month.   Electronic Signatures: Ida Rogue (MD)  (Signed 08-Mar-13 10:01)  Authored: General Aspect/Present Illness, History and Physical Exam, Review of System, Past Medical History, Health Issues, Home Medications, Labs, Radiology, Allergies, Vital Signs/Nurse's Notes, Impression/Plan   Last Updated: 08-Mar-13 10:01 by Ida Rogue (MD)
